# Patient Record
Sex: Female | Born: 1950 | Race: Black or African American | Hispanic: No | Smoking: Former smoker
Health system: Southern US, Community
[De-identification: ages and names within clinical notes are randomized; demographics above are authoritative.]

## PROBLEM LIST (undated history)

## (undated) DIAGNOSIS — R011 Cardiac murmur, unspecified: Secondary | ICD-10-CM

## (undated) DIAGNOSIS — M1990 Unspecified osteoarthritis, unspecified site: Secondary | ICD-10-CM

## (undated) DIAGNOSIS — N189 Chronic kidney disease, unspecified: Secondary | ICD-10-CM

## (undated) DIAGNOSIS — H269 Unspecified cataract: Secondary | ICD-10-CM

## (undated) HISTORY — PX: OTHER SURGICAL HISTORY: SHX169

## (undated) HISTORY — PX: CATARACT EXTRACTION, BILATERAL: SHX1313

## (undated) HISTORY — DX: Cardiac murmur, unspecified: R01.1

## (undated) HISTORY — DX: Chronic kidney disease, unspecified: N18.9

## (undated) HISTORY — PX: MYOMECTOMY: SHX85

## (undated) HISTORY — PX: ABDOMINAL HYSTERECTOMY: SHX81

## (undated) HISTORY — DX: Unspecified cataract: H26.9

## (undated) HISTORY — DX: Unspecified osteoarthritis, unspecified site: M19.90

---

## 2020-06-20 ENCOUNTER — Inpatient Hospital Stay (HOSPITAL_COMMUNITY): Payer: Medicare HMO | Attending: Hematology and Oncology | Admitting: Hematology and Oncology

## 2020-06-20 ENCOUNTER — Encounter (HOSPITAL_COMMUNITY): Payer: Self-pay | Admitting: Hematology and Oncology

## 2020-06-20 ENCOUNTER — Inpatient Hospital Stay (HOSPITAL_COMMUNITY): Payer: Medicare HMO

## 2020-06-20 ENCOUNTER — Other Ambulatory Visit: Payer: Self-pay

## 2020-06-20 VITALS — BP 134/69 | HR 54 | Resp 16 | Ht 63.25 in | Wt 121.9 lb

## 2020-06-20 DIAGNOSIS — I129 Hypertensive chronic kidney disease with stage 1 through stage 4 chronic kidney disease, or unspecified chronic kidney disease: Secondary | ICD-10-CM | POA: Diagnosis not present

## 2020-06-20 DIAGNOSIS — D72819 Decreased white blood cell count, unspecified: Secondary | ICD-10-CM | POA: Diagnosis not present

## 2020-06-20 DIAGNOSIS — N189 Chronic kidney disease, unspecified: Secondary | ICD-10-CM | POA: Insufficient documentation

## 2020-06-20 DIAGNOSIS — D649 Anemia, unspecified: Secondary | ICD-10-CM

## 2020-06-20 DIAGNOSIS — Z87891 Personal history of nicotine dependence: Secondary | ICD-10-CM | POA: Diagnosis not present

## 2020-06-20 DIAGNOSIS — D509 Iron deficiency anemia, unspecified: Secondary | ICD-10-CM | POA: Insufficient documentation

## 2020-06-20 DIAGNOSIS — N200 Calculus of kidney: Secondary | ICD-10-CM | POA: Diagnosis not present

## 2020-06-20 LAB — RETICULOCYTES
Immature Retic Fract: 12.3 % (ref 2.3–15.9)
RBC.: 3.4 MIL/uL — ABNORMAL LOW (ref 3.87–5.11)
Retic Count, Absolute: 38.8 10*3/uL (ref 19.0–186.0)
Retic Ct Pct: 1.1 % (ref 0.4–3.1)

## 2020-06-20 LAB — COMPREHENSIVE METABOLIC PANEL
ALT: 15 U/L (ref 0–44)
AST: 18 U/L (ref 15–41)
Albumin: 4 g/dL (ref 3.5–5.0)
Alkaline Phosphatase: 31 U/L — ABNORMAL LOW (ref 38–126)
Anion gap: 7 (ref 5–15)
BUN: 23 mg/dL (ref 8–23)
CO2: 27 mmol/L (ref 22–32)
Calcium: 9.6 mg/dL (ref 8.9–10.3)
Chloride: 102 mmol/L (ref 98–111)
Creatinine, Ser: 1.24 mg/dL — ABNORMAL HIGH (ref 0.44–1.00)
GFR, Estimated: 46 mL/min — ABNORMAL LOW (ref >60)
Glucose, Bld: 99 mg/dL (ref 70–99)
Potassium: 4 mmol/L (ref 3.5–5.1)
Sodium: 136 mmol/L (ref 135–145)
Total Bilirubin: 0.8 mg/dL (ref 0.3–1.2)
Total Protein: 7 g/dL (ref 6.5–8.1)

## 2020-06-20 LAB — CBC WITH DIFFERENTIAL/PLATELET
Abs Immature Granulocytes: 0 10*3/uL (ref 0.00–0.07)
Basophils Absolute: 0 10*3/uL (ref 0.0–0.1)
Basophils Relative: 0 %
Eosinophils Absolute: 0.1 10*3/uL (ref 0.0–0.5)
Eosinophils Relative: 2 %
HCT: 29.9 % — ABNORMAL LOW (ref 36.0–46.0)
Hemoglobin: 9.5 g/dL — ABNORMAL LOW (ref 12.0–15.0)
Immature Granulocytes: 0 %
Lymphocytes Relative: 43 %
Lymphs Abs: 1.5 10*3/uL (ref 0.7–4.0)
MCH: 28.3 pg (ref 26.0–34.0)
MCHC: 31.8 g/dL (ref 30.0–36.0)
MCV: 89 fL (ref 80.0–100.0)
Monocytes Absolute: 0.2 10*3/uL (ref 0.1–1.0)
Monocytes Relative: 6 %
Neutro Abs: 1.7 10*3/uL (ref 1.7–7.7)
Neutrophils Relative %: 49 %
Platelets: 152 10*3/uL (ref 150–400)
RBC: 3.36 MIL/uL — ABNORMAL LOW (ref 3.87–5.11)
RDW: 14.9 % (ref 11.5–15.5)
WBC: 3.4 10*3/uL — ABNORMAL LOW (ref 4.0–10.5)
nRBC: 0 % (ref 0.0–0.2)

## 2020-06-20 LAB — IRON AND TIBC
Iron: 31 ug/dL (ref 28–170)
Saturation Ratios: 12 % (ref 10.4–31.8)
TIBC: 269 ug/dL (ref 250–450)
UIBC: 238 ug/dL

## 2020-06-20 LAB — VITAMIN B12: Vitamin B-12: 794 pg/mL (ref 180–914)

## 2020-06-20 LAB — FERRITIN: Ferritin: 205 ng/mL (ref 11–307)

## 2020-06-20 LAB — LACTATE DEHYDROGENASE: LDH: 157 U/L (ref 98–192)

## 2020-06-20 NOTE — Progress Notes (Signed)
Unable to assess medication list patient states that she is having this faxed over.

## 2020-06-20 NOTE — Progress Notes (Signed)
[facility] CONSULT NOTE  No care team member to display  CHIEF COMPLAINTS/PURPOSE OF CONSULTATION:  IDA  ASSESSMENT & PLAN:  No problem-specific Assessment & Plan notes found for this encounter.  Orders Placed This Encounter  Procedures  . CBC with Differential/Platelet    Standing Status:   Standing    Number of Occurrences:   22    Standing Expiration Date:   06/20/2021  . Iron and TIBC    Standing Status:   Future    Standing Expiration Date:   06/20/2021  . Ferritin    Standing Status:   Future    Standing Expiration Date:   06/20/2021  . Vitamin B12    Standing Status:   Future    Standing Expiration Date:   06/20/2021  . Folate RBC    Standing Status:   Future    Standing Expiration Date:   06/20/2021  . Lactate dehydrogenase    Standing Status:   Future    Standing Expiration Date:   06/20/2021  . Reticulocytes    Standing Status:   Future    Standing Expiration Date:   06/20/2021  . Comprehensive metabolic panel    Standing Status:   Standing    Number of Occurrences:   33    Standing Expiration Date:   06/20/2021  . SPEP (Serum protein electrophoresis)    Standing Status:   Future    Standing Expiration Date:   06/20/2021  . Kappa/lambda light chains    Standing Status:   Future    Standing Expiration Date:   06/20/2021   1. 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 Hb ranging in 10-11, mild leukopenia which has been her baseline. No thrombocytopenia CBC, iron panel, b12, folate, LDH, retic count SPEP today. No concern for active blood loss from history If she is indeed iron deficient, will benefit from IV Iron. She is agreeable to IV iron, Discussed minimal side effects and very rare chances of anaphylaxis  2. Age appropriate cancer screening recommended  3. HTN, well controlled overall, she had a log  4. CKD and nephrolithiasis, FU with nephrology  HISTORY OF  PRESENTING ILLNESS:  Jennifer Li 70 y.o. female is here because of anemia.  70 yr old female patient who complains of extreme fatigue, sleepiness over the past few months. She had colonoscopy, capsule endoscopy which were normal according tot the patient. She has hemorrhoids so has noted small amount of blood when she is having a hemorrhoid flare. She otherwise has not noted any hematochezia or melena. No change in breathing, NO PICA or hair loss She has become a complete vegan in June 2021.  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  REVIEW OF SYSTEMS:   Constitutional: Denies fevers, chills or abnormal night sweats Eyes: Denies blurriness of vision, double vision or watery eyes Ears, nose, mouth, throat, and face: Denies mucositis or sore throat Respiratory: Denies cough, dyspnea or wheezes Cardiovascular: Denies palpitation, chest discomfort or lower extremity swelling Gastrointestinal:  Denies change in bowel habits Skin: Denies abnormal skin rashes Lymphatics: Denies new lymphadenopathy or easy bruising Neurological:Denies numbness, tingling or new weaknesses Behavioral/Psych: Mood is stable, no new changes  All other systems were reviewed with the patient and are negative.  MEDICAL HISTORY:  Past Medical History:  Diagnosis Date  .  Arthritis   . Cataract   . Chronic kidney disease    stage 3  . Heart murmur     SURGICAL HISTORY: Past Surgical History:  Procedure Laterality Date  . ABDOMINAL HYSTERECTOMY    . CATARACT EXTRACTION, BILATERAL    . gaglion cyst removal    . MYOMECTOMY      SOCIAL HISTORY: Social History   Socioeconomic History  . Marital status: Widowed    Spouse name: Not on file  . Number of children: Not on file  . Years of education: Not on file  . Highest education level: Not on file  Occupational History  . Not on file   Tobacco Use  . Smoking status: Former Games developer  . Smokeless tobacco: Never Used  Substance and Sexual Activity  . Alcohol use: Never  . Drug use: Never  . Sexual activity: Not Currently  Other Topics Concern  . Not on file  Social History Narrative  . Not on file   Social Determinants of Health   Financial Resource Strain: Low Risk   . Difficulty of Paying Living Expenses: Not hard at all  Food Insecurity: Food Insecurity Present  . Worried About Programme researcher, broadcasting/film/video in the Last Year: Sometimes true  . Ran Out of Food in the Last Year: Sometimes true  Transportation Needs: Unmet Transportation Needs  . Lack of Transportation (Medical): Yes  . Lack of Transportation (Non-Medical): No  Physical Activity: Not on file  Stress: No Stress Concern Present  . Feeling of Stress : Not at all  Social Connections: Moderately Integrated  . Frequency of Communication with Friends and Family: More than three times a week  . Frequency of Social Gatherings with Friends and Family: Once a week  . Attends Religious Services: More than 4 times per year  . Active Member of Clubs or Organizations: Yes  . Attends Banker Meetings: Never  . Marital Status: Widowed  Intimate Partner Violence: Not At Risk  . Fear of Current or Ex-Partner: No  . Emotionally Abused: No  . Physically Abused: No  . Sexually Abused: No    FAMILY HISTORY: Family History  Problem Relation Age of Onset  . Diabetes Sister   . Diabetes Son     ALLERGIES:  is allergic to escitalopram, gemfibrozil, sulfa antibiotics, and trazodone.  MEDICATIONS:  No current outpatient medications on file.   No current facility-administered medications for this visit.     PHYSICAL EXAMINATION: ECOG PERFORMANCE STATUS: 1 - Symptomatic but completely ambulatory  Vitals:   06/20/20 1135  BP: 134/69  Pulse: (!) 54  Resp: 16  SpO2: 100%   Filed Weights   06/20/20 1134  Weight: 121 lb 14.6 oz (55.3 kg)     GENERAL:alert, no distress and comfortable SKIN: skin color, texture, turgor are normal, no rashes or significant lesions EYES: normal, conjunctiva are pink and non-injected, sclera clear OROPHARYNX:no exudate, no erythema and lips, buccal mucosa, and tongue normal  NECK: supple, thyroid normal size, non-tender, without nodularity LYMPH:  no palpable lymphadenopathy in the cervical, axillary or inguinal LUNGS: clear to auscultation and percussion with normal breathing effort HEART: regular rate & rhythm and no murmurs and no lower extremity edema ABDOMEN:abdomen soft, non-tender and normal bowel sounds Musculoskeletal:no cyanosis of digits and no clubbing  PSYCH: alert & oriented x 3 with fluent speech NEURO: no focal motor/sensory deficits  LABORATORY DATA:  I have reviewed the data as listed No results found for: WBC, HGB, HCT, MCV,  PLT   Chemistry   No results found for: NA, K, CL, CO2, BUN, CREATININE, GLU No results found for: CALCIUM, ALKPHOS, AST, ALT, BILITOT     RADIOGRAPHIC STUDIES: I have personally reviewed the radiological images as listed and agreed with the findings in the report. No results found.  All questions were answered. The patient knows to call the clinic with any problems, questions or concerns. I spent 35 minutes in the care of this patient including H and P, review of records, counseling and coordination of care.     Rachel Moulds, MD 06/20/2020 12:36 PM

## 2020-06-23 LAB — PROTEIN ELECTROPHORESIS, SERUM
A/G Ratio: 1.5 (ref 0.7–1.7)
Albumin ELP: 4 g/dL (ref 2.9–4.4)
Alpha-1-Globulin: 0.2 g/dL (ref 0.0–0.4)
Alpha-2-Globulin: 0.7 g/dL (ref 0.4–1.0)
Beta Globulin: 0.8 g/dL (ref 0.7–1.3)
Gamma Globulin: 1 g/dL (ref 0.4–1.8)
Globulin, Total: 2.7 g/dL (ref 2.2–3.9)
Total Protein ELP: 6.7 g/dL (ref 6.0–8.5)

## 2020-06-23 LAB — FOLATE RBC
Folate, Hemolysate: 438 ng/mL
Folate, RBC: 1470 ng/mL (ref >498)
Hematocrit: 29.8 % — ABNORMAL LOW (ref 34.0–46.6)

## 2020-06-23 LAB — KAPPA/LAMBDA LIGHT CHAINS
Kappa free light chain: 29.5 mg/L — ABNORMAL HIGH (ref 3.3–19.4)
Kappa, lambda light chain ratio: 1.52 (ref 0.26–1.65)
Lambda free light chains: 19.4 mg/L (ref 5.7–26.3)

## 2020-06-24 ENCOUNTER — Telehealth (HOSPITAL_COMMUNITY): Payer: Self-pay

## 2020-06-24 NOTE — Telephone Encounter (Signed)
Patient called and requested notes from last office visit be faxed to her primary care physician office.  Stated that she has an appointment with him tomorrow and would like him to be aware.  No further questions or concerns at this time.

## 2020-07-18 ENCOUNTER — Encounter (HOSPITAL_COMMUNITY): Payer: Self-pay | Admitting: Hematology and Oncology

## 2020-07-18 ENCOUNTER — Inpatient Hospital Stay (HOSPITAL_COMMUNITY): Payer: Medicare HMO | Attending: Hematology and Oncology | Admitting: Hematology and Oncology

## 2020-07-18 ENCOUNTER — Other Ambulatory Visit: Payer: Self-pay

## 2020-07-18 VITALS — BP 133/70 | HR 61 | Temp 98.4°F | Resp 17 | Wt 119.2 lb

## 2020-07-18 DIAGNOSIS — N1831 Chronic kidney disease, stage 3a: Secondary | ICD-10-CM

## 2020-07-18 DIAGNOSIS — I129 Hypertensive chronic kidney disease with stage 1 through stage 4 chronic kidney disease, or unspecified chronic kidney disease: Secondary | ICD-10-CM | POA: Diagnosis not present

## 2020-07-18 DIAGNOSIS — Z87891 Personal history of nicotine dependence: Secondary | ICD-10-CM | POA: Insufficient documentation

## 2020-07-18 DIAGNOSIS — D649 Anemia, unspecified: Secondary | ICD-10-CM

## 2020-07-18 DIAGNOSIS — N189 Chronic kidney disease, unspecified: Secondary | ICD-10-CM | POA: Diagnosis not present

## 2020-07-18 DIAGNOSIS — I10 Essential (primary) hypertension: Secondary | ICD-10-CM | POA: Diagnosis not present

## 2020-07-18 NOTE — Progress Notes (Signed)
[facility] CONSULT NOTE  Patient Care Team: Pcp, No as PCP - General  CHIEF COMPLAINTS/PURPOSE OF CONSULTATION:  IDA  ASSESSMENT & PLAN:  No problem-specific Assessment & Plan notes found for this encounter.  No orders of the defined types were placed in this encounter.  1 Normocytic normochromic anemia Repeat labs reviewed, no nutritional deficiency, No hemolysis No Monoclonal gammopathy She reports some intermittent bright red blood from her bottom. We called her primary doc's office and they read out the colonoscopy and capsule endoscopy reports to [place], these do not have evidence of malignancy. At this time, I discussed considering BMB since she has persistent anemia, she is agreeable. I ordered CT guided BMB. She will RTC after BMB to review recommendations.  2. Age appropriate cancer screening recommended  3. HTN, well controlled overall, she had a log  4. CKD and nephrolithiasis, FU with nephrology  HISTORY OF PRESENTING ILLNESS:  Jennifer Li 70 y.o. female is here because of anemia.  70 yr old female patient who complains of extreme fatigue, sleepiness over the past few months.   She had colonoscopy, capsule endoscopy which were normal according tot the patient. She has hemorrhoids so has noted some bleeding when she is having a hemorrhoid flare. She otherwise has not noted any hematochezia or melena. No change in breathing, NO PICA or hair loss She has become a complete vegan in June 2021.  Since last visit, she continues to complain of fatigue. 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  REVIEW OF SYSTEMS:    Constitutional: Denies fevers, chills or abnormal night sweats Eyes: Denies blurriness of vision, double vision or watery eyes Ears, nose, mouth, throat, and face: Denies mucositis or sore throat Respiratory: Denies cough, dyspnea or wheezes Cardiovascular:  Denies palpitation, chest discomfort or lower extremity swelling Gastrointestinal:  Denies change in bowel habits Skin: Denies abnormal skin rashes Lymphatics: Denies new lymphadenopathy or easy bruising Neurological:Denies numbness, tingling or new weaknesses Behavioral/Psych: Mood is stable, no new changes  All other systems were reviewed with the patient and are negative.  MEDICAL HISTORY:  Past Medical History:  Diagnosis Date  . Arthritis   . Cataract   . Chronic kidney disease    stage 3  . Heart murmur     SURGICAL HISTORY: Past Surgical History:  Procedure Laterality Date  . ABDOMINAL HYSTERECTOMY    . CATARACT EXTRACTION, BILATERAL    . gaglion cyst removal    . MYOMECTOMY      SOCIAL HISTORY: Social History   Socioeconomic History  . Marital status: Widowed    Spouse name: Not on file  . Number of children: Not on file  . Years of education: Not on file  . Highest education level: Not on file  Occupational History  . Not on file  Tobacco Use  . Smoking status: Former Games developer  . Smokeless tobacco: Never Used  Substance and Sexual Activity  . Alcohol use: Never  . Drug use: Never  . Sexual activity: Not Currently  Other Topics Concern  . Not on file  Social History Narrative  . Not on file   Social Determinants of Health   Financial Resource Strain: Low Risk   . Difficulty of Paying Living Expenses: Not hard at all  Food Insecurity: Food Insecurity Present  . Worried About Programme researcher, broadcasting/film/video in the Last Year: Sometimes true  . Ran Out of Food  in the Last Year: Sometimes true  Transportation Needs: Unmet Transportation Needs  . Lack of Transportation (Medical): Yes  . Lack of Transportation (Non-Medical): No  Physical Activity: Not on file  Stress: No Stress Concern Present  . Feeling of Stress : Not at all  Social Connections: Moderately Integrated  . Frequency of Communication with Friends and Family: More than three times a week  . Frequency  of Social Gatherings with Friends and Family: Once a week  . Attends Religious Services: More than 4 times per year  . Active Member of Clubs or Organizations: Yes  . Attends Banker Meetings: Never  . Marital Status: Widowed  Intimate Partner Violence: Not At Risk  . Fear of Current or Ex-Partner: No  . Emotionally Abused: No  . Physically Abused: No  . Sexually Abused: No    FAMILY HISTORY: Family History  Problem Relation Age of Onset  . Diabetes Sister   . Diabetes Son     ALLERGIES:  is allergic to escitalopram, gemfibrozil, sulfa antibiotics, and trazodone.  MEDICATIONS:  Current Outpatient Medications  Medication Sig Dispense Refill  . losartan (COZAAR) 100 MG tablet losartan 100 mg tablet    . metoprolol tartrate (LOPRESSOR) 100 MG tablet metoprolol tartrate 100 mg tablet    . nitroGLYCERIN (NITROSTAT) 0.4 MG SL tablet nitroglycerin 0.4 mg sublingual tablet    . spironolactone (ALDACTONE) 25 MG tablet spironolactone 25 mg tablet    . amitriptyline (ELAVIL) 25 MG tablet Take 25 mg by mouth at bedtime.    Marland Kitchen ascorbic acid (VITAMIN C) 1000 MG tablet Take by mouth.    . Cholecalciferol 25 MCG (1000 UT) capsule 1 capsule 1 (one) time each day    . clonazePAM (KLONOPIN) 0.5 MG tablet clonazepam 0.5 mg tablet    . cloNIDine (CATAPRES) 0.1 MG tablet clonidine HCl 0.1 mg tablet    . cycloSPORINE (RESTASIS) 0.05 % ophthalmic emulsion Restasis 0.05 % eye drops in a dropperette    . Ferrous Gluconate 239 (27 Fe) MG TABS Take by mouth.    Marland Kitchen NIFEdipine (PROCARDIA XL/NIFEDICAL XL) 60 MG 24 hr tablet Take 60 mg by mouth daily.    . pantoprazole (PROTONIX) 40 MG tablet TAKE 1 TABLET EVERY DAY BY ORAL ROUTE AS NEEDED.    Marland Kitchen potassium chloride SA (KLOR-CON) 20 MEQ tablet Klor-Con M20 mEq tablet,extended release    . RESTASIS 0.05 % ophthalmic emulsion     . temazepam (RESTORIL) 15 MG capsule temazepam 15 mg capsule  PRN     No current facility-administered medications for  this visit.     PHYSICAL EXAMINATION: ECOG PERFORMANCE STATUS: 1 - Symptomatic but completely ambulatory  Vitals:   07/18/20 1212  BP: 133/70  Pulse: 61  Resp: 17  Temp: 98.4 F (36.9 C)  SpO2: 100%   Filed Weights   07/18/20 1212  Weight: 119 lb 4 oz (54.1 kg)    GENERAL:alert, no distress and comfortable SKIN: skin color, texture, turgor are normal, no rashes or significant lesions EYES: normal, conjunctiva are pink and non-injected, sclera clear OROPHARYNX:no exudate, no erythema and lips, buccal mucosa, and tongue normal  NECK: supple, thyroid normal size, non-tender, without nodularity LYMPH:  no palpable lymphadenopathy in the cervical, axillary or inguinal LUNGS: clear to auscultation and percussion with normal breathing effort HEART: regular rate & rhythm and no murmurs and no lower extremity edema ABDOMEN:abdomen soft, non-tender and normal bowel sounds Musculoskeletal:no cyanosis of digits and no clubbing  PSYCH: alert & oriented  x 3 with fluent speech NEURO: no focal motor/sensory deficits  LABORATORY DATA:  I have reviewed the data as listed Lab Results  Component Value Date   WBC 3.4 (L) 06/20/2020   HGB 9.5 (L) 06/20/2020   HCT 29.9 (L) 06/20/2020   HCT 29.8 (L) 06/20/2020   MCV 89.0 06/20/2020   PLT 152 06/20/2020     Chemistry      Component Value Date/Time   NA 136 06/20/2020 1315   K 4.0 06/20/2020 1315   CL 102 06/20/2020 1315   CO2 27 06/20/2020 1315   BUN 23 06/20/2020 1315   CREATININE 1.24 (H) 06/20/2020 1315      Component Value Date/Time   CALCIUM 9.6 06/20/2020 1315   ALKPHOS 31 (L) 06/20/2020 1315   AST 18 06/20/2020 1315   ALT 15 06/20/2020 1315   BILITOT 0.8 06/20/2020 1315      RADIOGRAPHIC STUDIES: I have personally reviewed the radiological images as listed and agreed with the findings in the report.  No results found.  All questions were answered. The patient knows to call the clinic with any problems, questions or  concerns.  I spent 30 minutes in the care of this patient including H and P, review of records, counseling and coordination of care.     Rachel Moulds, MD 07/18/2020 12:50 PM

## 2020-08-06 ENCOUNTER — Other Ambulatory Visit: Payer: Self-pay | Admitting: Radiology

## 2020-08-07 ENCOUNTER — Ambulatory Visit (HOSPITAL_COMMUNITY)
Admission: RE | Admit: 2020-08-07 | Discharge: 2020-08-07 | Disposition: A | Discharge status: Home / Self Care | Payer: Medicare HMO | Source: Ambulatory Visit | Attending: Hematology and Oncology | Admitting: Hematology and Oncology

## 2020-08-07 ENCOUNTER — Encounter (HOSPITAL_COMMUNITY): Payer: Self-pay

## 2020-08-07 ENCOUNTER — Other Ambulatory Visit: Payer: Self-pay

## 2020-08-07 DIAGNOSIS — D72822 Plasmacytosis: Secondary | ICD-10-CM | POA: Insufficient documentation

## 2020-08-07 DIAGNOSIS — Z87891 Personal history of nicotine dependence: Secondary | ICD-10-CM | POA: Diagnosis not present

## 2020-08-07 DIAGNOSIS — Z01812 Encounter for preprocedural laboratory examination: Secondary | ICD-10-CM | POA: Diagnosis not present

## 2020-08-07 DIAGNOSIS — M1990 Unspecified osteoarthritis, unspecified site: Secondary | ICD-10-CM | POA: Diagnosis not present

## 2020-08-07 DIAGNOSIS — D649 Anemia, unspecified: Secondary | ICD-10-CM | POA: Diagnosis not present

## 2020-08-07 DIAGNOSIS — Z79899 Other long term (current) drug therapy: Secondary | ICD-10-CM | POA: Insufficient documentation

## 2020-08-07 DIAGNOSIS — N183 Chronic kidney disease, stage 3 unspecified: Secondary | ICD-10-CM | POA: Diagnosis not present

## 2020-08-07 LAB — CBC WITH DIFFERENTIAL/PLATELET
Abs Immature Granulocytes: 0.02 10*3/uL (ref 0.00–0.07)
Basophils Absolute: 0 10*3/uL (ref 0.0–0.1)
Basophils Relative: 0 %
Eosinophils Absolute: 0.1 10*3/uL (ref 0.0–0.5)
Eosinophils Relative: 2 %
HCT: 30.2 % — ABNORMAL LOW (ref 36.0–46.0)
Hemoglobin: 9.7 g/dL — ABNORMAL LOW (ref 12.0–15.0)
Immature Granulocytes: 1 %
Lymphocytes Relative: 40 %
Lymphs Abs: 1.6 10*3/uL (ref 0.7–4.0)
MCH: 27.9 pg (ref 26.0–34.0)
MCHC: 32.1 g/dL (ref 30.0–36.0)
MCV: 86.8 fL (ref 80.0–100.0)
Monocytes Absolute: 0.3 10*3/uL (ref 0.1–1.0)
Monocytes Relative: 8 %
Neutro Abs: 2 10*3/uL (ref 1.7–7.7)
Neutrophils Relative %: 49 %
Platelets: 122 10*3/uL — ABNORMAL LOW (ref 150–400)
RBC: 3.48 MIL/uL — ABNORMAL LOW (ref 3.87–5.11)
RDW: 15 % (ref 11.5–15.5)
WBC: 4 10*3/uL (ref 4.0–10.5)
nRBC: 0 % (ref 0.0–0.2)

## 2020-08-07 LAB — PROTIME-INR
INR: 1.1 (ref 0.8–1.2)
Prothrombin Time: 13.3 seconds (ref 11.4–15.2)

## 2020-08-07 MED ORDER — SODIUM CHLORIDE 0.9 % IV SOLN: INTRAVENOUS | Status: DC

## 2020-08-07 MED ORDER — MIDAZOLAM HCL 2 MG/2ML IJ SOLN
INTRAMUSCULAR | Status: AC
  Filled 2020-08-07: qty 2

## 2020-08-07 MED ORDER — LIDOCAINE HCL (PF) 1 % IJ SOLN
INTRAMUSCULAR | Status: AC | PRN
  Administered 2020-08-07: 10 mL via INTRADERMAL

## 2020-08-07 MED ORDER — MIDAZOLAM HCL 2 MG/2ML IJ SOLN
INTRAMUSCULAR | Status: AC | PRN
  Administered 2020-08-07: 1 mg via INTRAVENOUS

## 2020-08-07 MED ORDER — FENTANYL CITRATE (PF) 100 MCG/2ML IJ SOLN
INTRAMUSCULAR | Status: AC | PRN
  Administered 2020-08-07: 50 ug via INTRAVENOUS

## 2020-08-07 MED ORDER — FLUMAZENIL 0.5 MG/5ML IV SOLN
INTRAVENOUS | Status: AC
  Filled 2020-08-07: qty 5

## 2020-08-07 MED ORDER — FENTANYL CITRATE (PF) 100 MCG/2ML IJ SOLN
INTRAMUSCULAR | Status: AC
  Filled 2020-08-07: qty 2

## 2020-08-07 MED ORDER — NALOXONE HCL 0.4 MG/ML IJ SOLN
INTRAMUSCULAR | Status: AC
  Filled 2020-08-07: qty 1

## 2020-08-07 NOTE — Discharge Instructions (Addendum)
Bone Marrow Aspiration and Bone Marrow Biopsy, Adult, Care After  If you have problems or questions, contact your health care provider. What can I expect after the procedure? After the procedure, it is common to have:  Mild pain and tenderness.  Swelling.  Bruising. Follow these instructions at home: Puncture site care You may remove the dressing in 24 hours (11:30am Friday 08/08/20) You do not have to place another dressing over the site.  Check your puncture site every day for signs of infection. Check for: ? More redness, swelling, or pain. ? Fluid or blood. ? Warmth. ? Pus or a bad smell.   Activity  Return to your normal daily activities in 24 hours.  Do not lift anything that is heavier than 10 lb (4.5 kg) for about 1 week.  Do not drive for 24 hours if you were given a sedative during your procedure. General instructions  Take over-the-counter and prescription medicines only as told by your health care provider.  Do not take baths, swim, or use a hot tub until your health care provider approves. You may shower after 24 hours and the dressing has been removed.  For comfort: put ice on the affected area. To do this: ? Put ice in a plastic bag. ? Place a towel between your skin and the bag. ? Leave the ice on for 20 minutes, 2-3 times a day.  Keep all follow-up visits as told by your health care provider. This is important.   Contact a health care provider if:  Your pain is not controlled with medicine.  You have a fever.  You have more redness, swelling, or pain around the puncture site.  You have fluid or blood coming from the puncture site.  Your puncture site feels warm to the touch.  You have pus or a bad smell coming from the puncture site. Summary  After the procedure, it is common to have mild pain, tenderness, swelling, and bruising.  Follow instructions from your health care provider about how to take care of the puncture site and what activities  are safe for you.  Take over-the-counter and prescription medicines only as told by your health care provider.  Contact a health care provider if you have any signs of infection, such as fluid or blood coming from the puncture site.  Moderate Conscious Sedation, Adult, Care After This sheet gives you information about how to care for yourself after your procedure. Your health care provider may also give you more specific instructions. If you have problems or questions, contact your health care provider. What can I expect after the procedure? After the procedure, it is common to have:  Sleepiness for several hours.  Impaired judgment for several hours.  Difficulty with balance.  Vomiting if you eat too soon. Follow these instructions at home: Rest for the next 24 hours. For the next 24 hours:  Do not participate in activities where you could fall or become injured.  Do not drive or use machinery.  Do not drink alcohol.  Do not take sleeping pills or medicines that cause drowsiness.  Do not make important decisions or sign legal documents.  Do not take care of children on your own.      Eating and drinking  Diet as tolerated  Drink enough fluid to keep your urine pale yellow.  If you vomit: ? Drink water, juice, or soup when you can drink without vomiting. ? Make sure you have little or no nausea before eating solid  foods.   General instructions  Take over-the-counter and prescription medicines as directed.  Have a responsible adult stay with you for 24 hours. It is important to have someone help care for you until you are awake and alert.  Do not smoke.  Keep all follow-up visits as told by your health care provider. This is important. Contact a health care provider if:  You are still sleepy or having trouble with balance after 24 hours.  You feel light-headed.  You keep feeling nauseous or you keep vomiting.  You develop a rash.  You have a fever.  You  have redness or swelling around the IV site. Get help right away if:  You have trouble breathing.  You have new-onset confusion at home. Summary  After the procedure, it is common to feel sleepy, have impaired judgment, or feel nauseous if you eat too soon.  Rest after you get home. Know the things you should not do after the procedure.  Follow the diet recommended by your health care provider and drink enough fluid to keep your urine pale yellow.  Get help right away if you have trouble breathing or new-onset confusion at home.

## 2020-08-07 NOTE — Procedures (Signed)
Interventional Radiology Procedure Note  Procedure: CT guided aspirate and core biopsy of right iliac bone Complications: None Recommendations: - Bedrest supine x 1 hrs - Hydrocodone PRN  Pain - Follow biopsy results  Signed,  Harlan Vinal K. Gaylon Bentz, MD   

## 2020-08-07 NOTE — Consult Note (Signed)
Chief Complaint: Patient was seen in consultation today for CT guided bone marrow biopsy  Referring Physician(s): Iruku,Praveena  Supervising Physician: Jacqulynn Cadet  Patient Status: [facility] - Out-pt  History of Present Illness: Jennifer Li is a 70 y.o. female with past medical history of arthritis, chronic kidney disease and now with anemia of uncertain etiology.  She presents today for CT-guided bone marrow biopsy for further evaluation.   Past Medical History:  Diagnosis Date  . Arthritis   . Cataract   . Chronic kidney disease    stage 3  . Heart murmur     Past Surgical History:  Procedure Laterality Date  . ABDOMINAL HYSTERECTOMY    . CATARACT EXTRACTION, BILATERAL    . gaglion cyst removal    . MYOMECTOMY      Allergies: Escitalopram, Gemfibrozil, Sulfa antibiotics, and Trazodone  Medications: Prior to Admission medications   Medication Sig Start Date End Date Taking? Authorizing Provider  amitriptyline (ELAVIL) 25 MG tablet Take 25 mg by mouth at bedtime. 02/19/20   [provider]  ascorbic acid (VITAMIN C) 1000 MG tablet Take by mouth.    [provider]  Cholecalciferol 25 MCG (1000 UT) capsule 1 capsule 1 (one) time each day    [provider]  clonazePAM (KLONOPIN) 0.5 MG tablet clonazepam 0.5 mg tablet    [provider]  cloNIDine (CATAPRES) 0.1 MG tablet clonidine HCl 0.1 mg tablet    [provider]  cycloSPORINE (RESTASIS) 0.05 % ophthalmic emulsion Restasis 0.05 % eye drops in a dropperette    [provider]  Ferrous Gluconate 239 (27 Fe) MG TABS Take by mouth.    [provider]  losartan (COZAAR) 100 MG tablet losartan 100 mg tablet 08/13/19   [provider]  metoprolol tartrate (LOPRESSOR) 100 MG tablet metoprolol tartrate 100 mg tablet 01/23/20   [provider]  NIFEdipine (PROCARDIA XL/NIFEDICAL XL) 60 MG 24 hr tablet Take 60 mg by mouth daily. 03/12/20    [provider]  nitroGLYCERIN (NITROSTAT) 0.4 MG SL tablet nitroglycerin 0.4 mg sublingual tablet 12/15/16   [provider]  pantoprazole (PROTONIX) 40 MG tablet TAKE 1 TABLET EVERY DAY BY ORAL ROUTE AS NEEDED. 07/13/20   [provider]  potassium chloride SA (KLOR-CON) 20 MEQ tablet Klor-Con M20 mEq tablet,extended release    [provider]  RESTASIS 0.05 % ophthalmic emulsion  04/25/20   [provider]  spironolactone (ALDACTONE) 25 MG tablet spironolactone 25 mg tablet 04/01/20   [provider]  temazepam (RESTORIL) 15 MG capsule temazepam 15 mg capsule  PRN    [provider]     Family History  Problem Relation Age of Onset  . Diabetes Sister   . Diabetes Son     Social History   Socioeconomic History  . Marital status: Widowed    Spouse name: Not on file  . Number of children: Not on file  . Years of education: Not on file  . Highest education level: Not on file  Occupational History  . Not on file  Tobacco Use  . Smoking status: Former Research scientist (life sciences)  . Smokeless tobacco: Never Used  Substance and Sexual Activity  . Alcohol use: Never  . Drug use: Never  . Sexual activity: Not Currently  Other Topics Concern  . Not on file  Social History Narrative  . Not on file   Social Determinants of Health   Financial Resource Strain: Low Risk   . Difficulty  of Paying Living Expenses: Not hard at all  Food Insecurity: Food Insecurity Present  . Worried About Charity fundraiser in the Last Year: Sometimes true  . Ran Out of Food in the Last Year: Sometimes true  Transportation Needs: Unmet Transportation Needs  . Lack of Transportation (Medical): Yes  . Lack of Transportation (Non-Medical): No  Physical Activity: Not on file  Stress: No Stress Concern Present  . Feeling of Stress : Not at all  Social Connections: Moderately Integrated  . Frequency of Communication with Friends and Family: More than three times a  week  . Frequency of Social Gatherings with Friends and Family: Once a week  . Attends Religious Services: More than 4 times per year  . Active Member of Clubs or Organizations: Yes  . Attends Archivist Meetings: Never  . Marital Status: Widowed      Review of Systems denies fever, headache, chest pain, dyspnea, cough, abdominal/back pain, nausea, vomiting or bleeding.  She has had some weight loss.  Vital Signs: BP 133/68   Pulse (!) 57   Temp 97.8 F (36.6 C) (Oral)   Resp 18   SpO2 100%   Physical Exam awake, alert.  Chest clear to auscultation bilaterally.  Heart with slightly bradycardic but regular rhythm.  Abdomen soft, positive bowel sounds, nontender.  No lower extremity edema.  Imaging: No results found.  Labs:  CBC: Recent Labs    06/20/20 1315  WBC 3.4*  HGB 9.5*  HCT 29.9*  29.8*  PLT 152    COAGS: No results for input(s): INR, APTT in the last 8760 hours.  BMP: Recent Labs    06/20/20 1315  NA 136  K 4.0  CL 102  CO2 27  GLUCOSE 99  BUN 23  CALCIUM 9.6  CREATININE 1.24*  GFRNONAA 46*    LIVER FUNCTION TESTS: Recent Labs    06/20/20 1315  BILITOT 0.8  AST 18  ALT 15  ALKPHOS 31*  PROT 7.0  ALBUMIN 4.0    TUMOR MARKERS: No results for input(s): AFPTM, CEA, CA199, CHROMGRNA in the last 8760 hours.  Assessment and Plan: 70 y.o. female with past medical history of arthritis, chronic kidney disease and now with anemia of uncertain etiology.  She presents today for CT-guided bone marrow biopsy for further evaluation.Risks and benefits of procedure was discussed with the patient  including, but not limited to bleeding, infection, damage to adjacent structures or low yield requiring additional tests.  All of the questions were answered and there is agreement to proceed.  Consent signed and in chart.     Thank you for this interesting consult.  I greatly enjoyed meeting Jennifer Li and look forward to participating in  their care.  A copy of this report was sent to the requesting provider on this date.  Electronically Signed: D. Rowe Robert, PA-C 08/07/2020, 8:56 AM   I spent a total of  20 minutes   in face to face in clinical consultation, greater than 50% of which was counseling/coordinating care for CT-guided bone marrow biopsy

## 2020-08-08 ENCOUNTER — Ambulatory Visit (HOSPITAL_COMMUNITY): Payer: Medicare HMO | Admitting: Hematology and Oncology

## 2020-08-15 ENCOUNTER — Encounter (HOSPITAL_COMMUNITY): Payer: Self-pay | Admitting: Hematology and Oncology

## 2020-08-18 LAB — SURGICAL PATHOLOGY

## 2020-08-21 ENCOUNTER — Ambulatory Visit (HOSPITAL_COMMUNITY): Payer: Medicare HMO | Admitting: Hematology

## 2020-08-22 ENCOUNTER — Other Ambulatory Visit: Payer: Self-pay

## 2020-08-22 ENCOUNTER — Inpatient Hospital Stay (HOSPITAL_COMMUNITY): Payer: Medicare HMO | Attending: Hematology and Oncology | Admitting: Hematology and Oncology

## 2020-08-22 ENCOUNTER — Encounter (HOSPITAL_COMMUNITY): Payer: Self-pay | Admitting: Hematology and Oncology

## 2020-08-22 VITALS — BP 116/68 | HR 59 | Temp 98.7°F | Resp 17 | Wt 121.4 lb

## 2020-08-22 DIAGNOSIS — D649 Anemia, unspecified: Secondary | ICD-10-CM | POA: Insufficient documentation

## 2020-08-22 DIAGNOSIS — N189 Chronic kidney disease, unspecified: Secondary | ICD-10-CM | POA: Insufficient documentation

## 2020-08-22 DIAGNOSIS — Z87891 Personal history of nicotine dependence: Secondary | ICD-10-CM | POA: Insufficient documentation

## 2020-08-22 DIAGNOSIS — N200 Calculus of kidney: Secondary | ICD-10-CM | POA: Diagnosis not present

## 2020-08-22 NOTE — Progress Notes (Signed)
[facility] CONSULT NOTE  Patient Care Team: System, Provider Not In as PCP - General  CHIEF COMPLAINTS/PURPOSE OF CONSULTATION:  IDA  ASSESSMENT & PLAN:  No problem-specific Assessment & Plan notes found for this encounter.  No orders of the defined types were placed in this encounter.  1 Normocytic normochromic anemia Repeat labs reviewed, no nutritional deficiency, No hemolysis No Monoclonal gammopathy She reports some intermittent bright red blood from her bottom. We called her primary doc's office and they read out the colonoscopy and capsule endoscopy reports to [place], these do not have evidence of malignancy. BMB without any evidence of dysplasia or increase in blasts. Bone marrow is normocellular with trilineage hematopoiesis. Mild polytypic plasmacytosis which is not of concern. At this time, there is no evidence of hematological cause for anemia, this is likely anemia of chronic disease. I have discussed about considering systemic scans to evaluate the weight loss. We discussed about CT or PET scan. Patient would like to return to clinic in 8 weeks and FU on labs and weight and then decide. She was encouraged to call us if she changes her mind.  2. Age appropriate cancer screening recommended  3. HTN, well controlled overall, she had a log  4. CKD and nephrolithiasis, FU with nephrology   HISTORY OF PRESENTING ILLNESS:   Jennifer Li 70 y.o. female is here because of anemia.  70 yr old female patient who complains of extreme fatigue and sleepiness over the past few months.   She had colonoscopy, capsule endoscopy which were normal according tot the patient.  She has hemorrhoids so has noted some bleeding when she is having a hemorrhoid flare. She otherwise has not noted any hematochezia or melena. No change in breathing, NO PICA or hair loss She has become a complete vegan in June 2021.   Interval history She complains of some ongoing fatigue,  weight appears to have stabilized. No change in bowel habits. Mild right sided abdominal pain, has history of nephrolithiasis, had a KUB done yesterday. She does bring up known diagnosis of malabsorption and gastritis No other concerns for me. Rest of the pertinent 10 point ROS reviewed and negative.  REVIEW OF SYSTEMS:    Constitutional: Denies fevers, chills or abnormal night sweats Eyes: Denies blurriness of vision, double vision or watery eyes Ears, nose, mouth, throat, and face: Denies mucositis or sore throat Respiratory: Denies cough, dyspnea or wheezes Cardiovascular: Denies palpitation, chest discomfort or lower extremity swelling Gastrointestinal:  Denies change in bowel habits Skin: Denies abnormal skin rashes Lymphatics: Denies new lymphadenopathy or easy bruising Neurological:Denies numbness, tingling or new weaknesses Behavioral/Psych: Mood is stable, no new changes  All other systems were reviewed with the patient and are negative.  MEDICAL HISTORY:  Past Medical History:  Diagnosis Date  . Arthritis   . Cataract   . Chronic kidney disease    stage 3  . Heart murmur     SURGICAL HISTORY: Past Surgical History:  Procedure Laterality Date  . ABDOMINAL HYSTERECTOMY    . CATARACT EXTRACTION, BILATERAL    . gaglion cyst removal    . MYOMECTOMY      SOCIAL HISTORY: Social History   Socioeconomic History  . Marital status: Widowed    Spouse name: Not on file  . Number of children: Not on file  . Years of education: Not on file  . Highest education level: Not on file  Occupational History  . Not on file  Tobacco Use  .  Smoking status: Former Research scientist (life sciences)  . Smokeless tobacco: Never Used  Substance and Sexual Activity  . Alcohol use: Never  . Drug use: Never  . Sexual activity: Not Currently  Other Topics Concern  . Not on file  Social History Narrative  . Not on file   Social Determinants of Health   Financial Resource Strain: Low Risk   . Difficulty  of Paying Living Expenses: Not hard at all  Food Insecurity: Food Insecurity Present  . Worried About Charity fundraiser in the Last Year: Sometimes true  . Ran Out of Food in the Last Year: Sometimes true  Transportation Needs: Unmet Transportation Needs  . Lack of Transportation (Medical): Yes  . Lack of Transportation (Non-Medical): No  Physical Activity: Not on file  Stress: No Stress Concern Present  . Feeling of Stress : Not at all  Social Connections: Moderately Integrated  . Frequency of Communication with Friends and Family: More than three times a week  . Frequency of Social Gatherings with Friends and Family: Once a week  . Attends Religious Services: More than 4 times per year  . Active Member of Clubs or Organizations: Yes  . Attends Archivist Meetings: Never  . Marital Status: Widowed  Intimate Partner Violence: Not At Risk  . Fear of Current or Ex-Partner: No  . Emotionally Abused: No  . Physically Abused: No  . Sexually Abused: No    FAMILY HISTORY: Family History  Problem Relation Age of Onset  . Diabetes Sister   . Diabetes Son     ALLERGIES:  is allergic to escitalopram, gemfibrozil, sulfa antibiotics, and trazodone.  MEDICATIONS:  Current Outpatient Medications  Medication Sig Dispense Refill  . amitriptyline (ELAVIL) 25 MG tablet Take 25 mg by mouth at bedtime.    Marland Kitchen ascorbic acid (VITAMIN C) 1000 MG tablet Take by mouth.    . Cholecalciferol 25 MCG (1000 UT) capsule 1 capsule 1 (one) time each day    . clonazePAM (KLONOPIN) 0.5 MG tablet clonazepam 0.5 mg tablet    . cloNIDine (CATAPRES) 0.1 MG tablet clonidine HCl 0.1 mg tablet    . cycloSPORINE (RESTASIS) 0.05 % ophthalmic emulsion Restasis 0.05 % eye drops in a dropperette    . Ferrous Gluconate 239 (27 Fe) MG TABS Take by mouth.    . losartan (COZAAR) 100 MG tablet losartan 100 mg tablet    . metoprolol tartrate (LOPRESSOR) 100 MG tablet metoprolol tartrate 100 mg tablet    .  NIFEdipine (PROCARDIA XL/NIFEDICAL XL) 60 MG 24 hr tablet Take 60 mg by mouth daily.    . nitroGLYCERIN (NITROSTAT) 0.4 MG SL tablet nitroglycerin 0.4 mg sublingual tablet    . pantoprazole (PROTONIX) 40 MG tablet TAKE 1 TABLET EVERY DAY BY ORAL ROUTE AS NEEDED.    Marland Kitchen potassium chloride SA (KLOR-CON) 20 MEQ tablet Klor-Con M20 mEq tablet,extended release    . RESTASIS 0.05 % ophthalmic emulsion     . spironolactone (ALDACTONE) 25 MG tablet spironolactone 25 mg tablet    . temazepam (RESTORIL) 15 MG capsule temazepam 15 mg capsule  PRN     No current facility-administered medications for this visit.     PHYSICAL EXAMINATION: ECOG PERFORMANCE STATUS: 1 - Symptomatic but completely ambulatory  Vitals:   08/22/20 1200  BP: 116/68  Pulse: (!) 59  Resp: 17  Temp: 98.7 F (37.1 C)  SpO2: 100%   Filed Weights   08/22/20 1200  Weight: 121 lb 6 oz (55.1 kg)  GENERAL:alert, no distress and comfortable ABDOMEN:abdomen soft, non-tender PSYCH: alert & oriented x 3 with fluent speech NEURO: moves all extremities spontaneously  LABORATORY DATA:  I have reviewed the data as listed Lab Results  Component Value Date   WBC 4.0 08/07/2020   HGB 9.7 (L) 08/07/2020   HCT 30.2 (L) 08/07/2020   MCV 86.8 08/07/2020   PLT 122 (L) 08/07/2020     Chemistry      Component Value Date/Time   NA 136 06/20/2020 1315   K 4.0 06/20/2020 1315   CL 102 06/20/2020 1315   CO2 27 06/20/2020 1315   BUN 23 06/20/2020 1315   CREATININE 1.24 (H) 06/20/2020 1315      Component Value Date/Time   CALCIUM 9.6 06/20/2020 1315   ALKPHOS 31 (L) 06/20/2020 1315   AST 18 06/20/2020 1315   ALT 15 06/20/2020 1315   BILITOT 0.8 06/20/2020 1315      RADIOGRAPHIC STUDIES: I have personally reviewed the radiological images as listed and agreed with the findings in the report.  CT BIOPSY  Result Date: 08/07/2020 INDICATION: 70 year old female with anemia of uncertain etiology. She presents for bone marrow  biopsy. EXAM: CT GUIDED BONE MARROW ASPIRATION AND CORE BIOPSY Interventional Radiologist:  Criselda Peaches, MD MEDICATIONS: None. ANESTHESIA/SEDATION: Moderate (conscious) sedation was employed during this procedure. A total of 1 milligrams versed and 50 micrograms fentanyl were administered intravenously. The patient's level of consciousness and vital signs were monitored continuously by radiology nursing throughout the procedure under my direct supervision. Total monitored sedation time: 10 minutes FLUOROSCOPY TIME:  None COMPLICATIONS: None immediate. Estimated blood loss: <25 mL PROCEDURE: Informed written consent was obtained from the patient after a thorough discussion of the procedural risks, benefits and alternatives. All questions were addressed. Maximal Sterile Barrier Technique was utilized including caps, mask, sterile gowns, sterile gloves, sterile drape, hand hygiene and skin antiseptic. A timeout was performed prior to the initiation of the procedure. The patient was positioned prone and non-contrast localization CT was performed of the pelvis to demonstrate the iliac marrow spaces. Maximal barrier sterile technique utilized including caps, mask, sterile gowns, sterile gloves, large sterile drape, hand hygiene, and betadine prep. Under sterile conditions and local anesthesia, an 11 gauge coaxial bone biopsy needle was advanced into the right iliac marrow space. Needle position was confirmed with CT imaging. Initially, bone marrow aspiration was performed. Next, the 11 gauge outer cannula was utilized to obtain a right iliac bone marrow core biopsy. Needle was removed. Hemostasis was obtained with compression. The patient tolerated the procedure well. Samples were prepared with the cytotechnologist. IMPRESSION: Technically successful CT-guided right iliac bone marrow aspiration and core biopsy. Electronically Signed   By: Jacqulynn Cadet M.D.   On: 08/07/2020 12:28   CT BONE MARROW BIOPSY &  ASPIRATION  Result Date: 08/07/2020 Criselda Peaches, MD     08/07/2020 11:35 AM Interventional Radiology Procedure Note Procedure: CT guided aspirate and core biopsy of right iliac bone Complications: None Recommendations: - Bedrest supine x 1 hrs - Hydrocodone PRN  Pain - Follow biopsy results Signed, Criselda Peaches, MD    All questions were answered. The patient knows to call the clinic with any problems, questions or concerns.  I spent 30 minutes in the care of this patient including History, review of records, counseling and coordination of care.     Benay Pike, MD 08/22/2020 12:14 PM

## 2020-09-17 ENCOUNTER — Telehealth (HOSPITAL_COMMUNITY): Payer: Self-pay | Admitting: *Deleted

## 2020-09-17 NOTE — Telephone Encounter (Signed)
Patient called requesting clarification on upcoming appointment and requested LOV note be forwarded to Dr. Wandra Scot, along with Bone marrow bx report.  Routed to provider and patient aware.

## 2020-10-20 ENCOUNTER — Ambulatory Visit (HOSPITAL_COMMUNITY): Payer: Medicare HMO | Admitting: Hematology

## 2020-10-20 ENCOUNTER — Other Ambulatory Visit (HOSPITAL_COMMUNITY): Payer: Medicare HMO

## 2020-11-03 ENCOUNTER — Ambulatory Visit (HOSPITAL_COMMUNITY): Payer: Medicare HMO | Admitting: Hematology

## 2020-11-03 ENCOUNTER — Inpatient Hospital Stay (HOSPITAL_COMMUNITY): Payer: Medicare HMO

## 2020-12-08 ENCOUNTER — Ambulatory Visit (HOSPITAL_COMMUNITY): Payer: Medicare HMO | Admitting: Hematology

## 2020-12-08 ENCOUNTER — Other Ambulatory Visit (HOSPITAL_COMMUNITY): Payer: Medicare HMO

## 2020-12-10 ENCOUNTER — Ambulatory Visit (HOSPITAL_COMMUNITY): Payer: Medicare HMO | Admitting: Hematology and Oncology

## 2020-12-10 ENCOUNTER — Other Ambulatory Visit (HOSPITAL_COMMUNITY): Payer: Medicare HMO

## 2021-02-15 NOTE — Progress Notes (Signed)
[place], [place] [address]   CLINIC:  Medical Oncology/Hematology  PCP:  System, Provider Not In None  None  REASON FOR VISIT:  Follow-up for IDA  PRIOR THERAPY: none  CURRENT THERAPY: surveillance  INTERVAL HISTORY:  [name], a 70 y.o. female, returns for routine follow-up for her IDA. Jennifer Li was last seen on 08/22/2020 by Dr. Benay Pike.  Today she reports feeling good. She has never previously had an iron infusion. She takes oral iron tablets BID and denies constipation. She reports cold sensation,  and occasional burning in her legs and arms for the past 3 weeks. She denies tingling and numbness in her hands and feet. She denies any previous blood transfusions, and she denies family history of leukemia. Polycythemia, and sickle cell disease. She is currently taking 1000 mg of Vitamin B-12. She denies any weight loss in the past 6 months.Her left leg has been chronically swollen.   REVIEW OF SYSTEMS:  Review of Systems  Constitutional:  Negative for appetite change and fatigue.  Cardiovascular:  Positive for leg swelling (L).  Gastrointestinal:  Negative for constipation.  Neurological:  Positive for numbness (burning in legs and arms).  All other systems reviewed and are negative.  PAST MEDICAL/SURGICAL HISTORY:  Past Medical History:  Diagnosis Date   Arthritis    Cataract    Chronic kidney disease    stage 3   Heart murmur    Past Surgical History:  Procedure Laterality Date   ABDOMINAL HYSTERECTOMY     CATARACT EXTRACTION, BILATERAL     gaglion cyst removal     MYOMECTOMY      SOCIAL HISTORY:  Social History   Socioeconomic History   Marital status: Widowed    Spouse name: Not on file   Number of children: Not on file   Years of education: Not on file   Highest education level: Not on file  Occupational History   Not on file  Tobacco Use   Smoking status: Former   Smokeless tobacco: Never   Substance and Sexual Activity   Alcohol use: Never   Drug use: Never   Sexual activity: Not Currently  Other Topics Concern   Not on file  Social History Narrative   Not on file   Social Determinants of Health   Financial Resource Strain: Low Risk    Difficulty of Paying Living Expenses: Not hard at all  Food Insecurity: Food Insecurity Present   Worried About Charity fundraiser in the Last Year: Sometimes true   Arboriculturist in the Last Year: Sometimes true  Transportation Needs: Unmet Transportation Needs   Lack of Transportation (Medical): Yes   Lack of Transportation (Non-Medical): No  Physical Activity: Not on file  Stress: No Stress Concern Present   Feeling of Stress : Not at all  Social Connections: Moderately Integrated   Frequency of Communication with Friends and Family: More than three times a week   Frequency of Social Gatherings with Friends and Family: Once a week   Attends Religious Services: More than 4 times per year   Active Member of [employer] or Organizations: Yes   Attends Archivist Meetings: Never   Marital Status: Widowed  Human resources officer Violence: Not At Risk   Fear of Current or Ex-Partner: No   Emotionally Abused: No   Physically Abused: No   Sexually Abused: No    FAMILY HISTORY:  Family History  Problem Relation  Age of Onset   Diabetes Sister    Diabetes Son     CURRENT MEDICATIONS:  Current Outpatient Medications  Medication Sig Dispense Refill   ascorbic acid (VITAMIN C) 1000 MG tablet Take by mouth.     cholecalciferol (VITAMIN D3) 25 MCG (1000 UNIT) tablet Take 1,000 Units by mouth daily.     Cholecalciferol 25 MCG (1000 UT) capsule 1 capsule 1 (one) time each day     clonazePAM (KLONOPIN) 0.5 MG tablet clonazepam 0.5 mg tablet     cloNIDine (CATAPRES) 0.1 MG tablet clonidine HCl 0.1 mg tablet     Ferrous Gluconate 239 (27 Fe) MG TABS Take by mouth.     losartan (COZAAR) 100 MG tablet losartan 100 mg tablet      metoprolol tartrate (LOPRESSOR) 100 MG tablet metoprolol tartrate 100 mg tablet     nitroGLYCERIN (NITROSTAT) 0.4 MG SL tablet nitroglycerin 0.4 mg sublingual tablet     pantoprazole (PROTONIX) 40 MG tablet TAKE 1 TABLET EVERY DAY BY ORAL ROUTE AS NEEDED.     spironolactone (ALDACTONE) 25 MG tablet spironolactone 25 mg tablet     temazepam (RESTORIL) 15 MG capsule temazepam 15 mg capsule  PRN     No current facility-administered medications for this visit.    ALLERGIES:  Allergies  Allergen Reactions   Escitalopram    Gemfibrozil Hives and Other (See Comments)   Sulfa Antibiotics Hives   Trazodone     PHYSICAL EXAM:  Performance status (ECOG): 1 - Symptomatic but completely ambulatory  Vitals:   02/16/21 1403  BP: (!) 155/81  Pulse: 62  Resp: 16  Temp: (!) 96.9 F (36.1 C)  SpO2: 100%   Wt Readings from Last 3 Encounters:  02/16/21 122 lb 11.2 oz (55.7 kg)  08/22/20 121 lb 6 oz (55.1 kg)  07/18/20 119 lb 4 oz (54.1 kg)   Physical Exam Vitals reviewed.  Constitutional:      Appearance: Normal appearance.  Cardiovascular:     Rate and Rhythm: Normal rate and regular rhythm.     Pulses: Normal pulses.     Heart sounds: Normal heart sounds.  Pulmonary:     Effort: Pulmonary effort is normal.     Breath sounds: Normal breath sounds.  Abdominal:     Palpations: Abdomen is soft. There is no hepatomegaly, splenomegaly or mass.     Tenderness: There is no abdominal tenderness.  Musculoskeletal:     Right lower leg: No edema.     Left lower leg: No edema.  Lymphadenopathy:     Cervical: No cervical adenopathy.     Right cervical: No superficial cervical adenopathy.    Left cervical: No superficial cervical adenopathy.     Upper Body:     Right upper body: No supraclavicular, axillary or pectoral adenopathy.     Left upper body: No supraclavicular, axillary or pectoral adenopathy.     Lower Body: No right inguinal adenopathy. No left inguinal adenopathy.   Neurological:     General: No focal deficit present.     Mental Status: She is alert and oriented to person, place, and time.  Psychiatric:        Mood and Affect: Mood normal.        Behavior: Behavior normal.    LABORATORY DATA:  I have reviewed the labs as listed.  CBC Latest Ref Rng & Units 02/16/2021 08/07/2020 06/20/2020  WBC 4.0 - 10.5 K/uL 3.6(L) 4.0 3.4(L)  Hemoglobin 12.0 - 15.0 g/dL 10.4(L) 9.7(L)  9.5(L)  Hematocrit 36.0 - 46.0 % 32.0(L) 30.2(L) 29.9(L)  Platelets 150 - 400 K/uL 158 122(L) 152   CMP Latest Ref Rng & Units 02/16/2021 06/20/2020  Glucose 70 - 99 mg/dL 103(H) 99  BUN 8 - 23 mg/dL 32(H) 23  Creatinine 0.44 - 1.00 mg/dL 1.46(H) 1.24(H)  Sodium 135 - 145 mmol/L 140 136  Potassium 3.5 - 5.1 mmol/L 3.8 4.0  Chloride 98 - 111 mmol/L 108 102  CO2 22 - 32 mmol/L 27 27  Calcium 8.9 - 10.3 mg/dL 9.1 9.6  Total Protein 6.5 - 8.1 g/dL 7.0 7.0  Total Bilirubin 0.3 - 1.2 mg/dL 1.2 0.8  Alkaline Phos 38 - 126 U/L 37(L) 31(L)  AST 15 - 41 U/L 19 18  ALT 0 - 44 U/L 13 15      Component Value Date/Time   RBC 3.59 (L) 02/16/2021 1251   MCV 89.1 02/16/2021 1251   MCH 29.0 02/16/2021 1251   MCHC 32.5 02/16/2021 1251   RDW 14.7 02/16/2021 1251   LYMPHSABS 1.3 02/16/2021 1251   MONOABS 0.2 02/16/2021 1251   EOSABS 0.1 02/16/2021 1251   BASOSABS 0.0 02/16/2021 1251    DIAGNOSTIC IMAGING:  I have independently reviewed the scans and discussed with the patient. No results found.   ASSESSMENT:  Normocytic normochromic anemia: - EGD and colonoscopy in March 2021 by Dr. Phillip Heal showed mild gastritis, diverticulosis, internal and external hemorrhoids, 1 polyp which was noncancerous. - Capsule endoscopy in September 2021 - Bone marrow biopsy on 08/07/2020, normocellular marrow with trilineage hematopoiesis.  Mild polytypic plasmacytosis.  Chromosome analysis was normal.  SPEP was negative.  Free light chain ratio was normal with elevated kappa light chains consistent with  CKD. - No prior history of blood transfusions.  No family history of sickle cell or thalassemia.  HTN: - She is on losartan 100 mg, Catapres 0.1 mg, Aldactone 25 mg and Lopressor 100 mg  CKD and nephrolithiasis: - She follows up with Dr. Elita Quick in [place].   PLAN:  Normocytic normochromic anemia: - She is taking iron tablet twice daily without any major problems since the beginning of the year. - She reports feeling cold which starts in the feet and spreads up.  She has burning sensation when she starts feeling cold.  This started about last 2 to 3 months back.  Denies any tingling or numbness in extremities.  She has occasional bleeding per rectum from hemorrhoids. - Reviewed labs from today.  Ferritin is 175 and percent saturation is 14.  Hemoglobin is 10.4. - Etiology of anemia is from CKD and relative iron deficiency. - Recommend a trial of parenteral iron therapy.  We talked about side effects of Venofer in detail.  She will receive 3 infusions. - She will return to clinic in 3 months with a repeat ferritin, iron panel and CBC.  We will also check S28, folic acid, methylmalonic acid, copper and TSH at that time.  Stage III CKD: - Creatinine today is 1.46 and close to her baseline. - Continue follow-up with Dr. Elita Quick.  Orders placed this encounter:  No orders of the defined types were placed in this encounter.    Derek Jack, MD [place] [phone]   I, Thana Ates, am acting as a scribe for Dr. Derek Jack.  I, Derek Jack MD, have reviewed the above documentation for accuracy and completeness, and I agree with the above.

## 2021-02-16 ENCOUNTER — Other Ambulatory Visit: Payer: Self-pay

## 2021-02-16 ENCOUNTER — Inpatient Hospital Stay (HOSPITAL_COMMUNITY): Payer: Medicare HMO | Attending: Hematology | Admitting: Hematology

## 2021-02-16 ENCOUNTER — Inpatient Hospital Stay (HOSPITAL_COMMUNITY): Payer: Medicare HMO

## 2021-02-16 VITALS — BP 155/81 | HR 62 | Temp 96.9°F | Resp 16 | Wt 122.7 lb

## 2021-02-16 DIAGNOSIS — D649 Anemia, unspecified: Secondary | ICD-10-CM | POA: Diagnosis not present

## 2021-02-16 DIAGNOSIS — D509 Iron deficiency anemia, unspecified: Secondary | ICD-10-CM | POA: Diagnosis not present

## 2021-02-16 LAB — COMPREHENSIVE METABOLIC PANEL
ALT: 13 U/L (ref 0–44)
AST: 19 U/L (ref 15–41)
Albumin: 4.1 g/dL (ref 3.5–5.0)
Alkaline Phosphatase: 37 U/L — ABNORMAL LOW (ref 38–126)
Anion gap: 5 (ref 5–15)
BUN: 32 mg/dL — ABNORMAL HIGH (ref 8–23)
CO2: 27 mmol/L (ref 22–32)
Calcium: 9.1 mg/dL (ref 8.9–10.3)
Chloride: 108 mmol/L (ref 98–111)
Creatinine, Ser: 1.46 mg/dL — ABNORMAL HIGH (ref 0.44–1.00)
GFR, Estimated: 38 mL/min — ABNORMAL LOW (ref >60)
Glucose, Bld: 103 mg/dL — ABNORMAL HIGH (ref 70–99)
Potassium: 3.8 mmol/L (ref 3.5–5.1)
Sodium: 140 mmol/L (ref 135–145)
Total Bilirubin: 1.2 mg/dL (ref 0.3–1.2)
Total Protein: 7 g/dL (ref 6.5–8.1)

## 2021-02-16 LAB — CBC WITH DIFFERENTIAL/PLATELET
Abs Immature Granulocytes: 0.01 10*3/uL (ref 0.00–0.07)
Basophils Absolute: 0 10*3/uL (ref 0.0–0.1)
Basophils Relative: 1 %
Eosinophils Absolute: 0.1 10*3/uL (ref 0.0–0.5)
Eosinophils Relative: 2 %
HCT: 32 % — ABNORMAL LOW (ref 36.0–46.0)
Hemoglobin: 10.4 g/dL — ABNORMAL LOW (ref 12.0–15.0)
Immature Granulocytes: 0 %
Lymphocytes Relative: 36 %
Lymphs Abs: 1.3 10*3/uL (ref 0.7–4.0)
MCH: 29 pg (ref 26.0–34.0)
MCHC: 32.5 g/dL (ref 30.0–36.0)
MCV: 89.1 fL (ref 80.0–100.0)
Monocytes Absolute: 0.2 10*3/uL (ref 0.1–1.0)
Monocytes Relative: 6 %
Neutro Abs: 2 10*3/uL (ref 1.7–7.7)
Neutrophils Relative %: 55 %
Platelets: 158 10*3/uL (ref 150–400)
RBC: 3.59 MIL/uL — ABNORMAL LOW (ref 3.87–5.11)
RDW: 14.7 % (ref 11.5–15.5)
WBC: 3.6 10*3/uL — ABNORMAL LOW (ref 4.0–10.5)
nRBC: 0 % (ref 0.0–0.2)

## 2021-02-16 LAB — IRON AND TIBC
Iron: 42 ug/dL (ref 28–170)
Saturation Ratios: 14 % (ref 10.4–31.8)
TIBC: 305 ug/dL (ref 250–450)
UIBC: 263 ug/dL

## 2021-02-16 LAB — FERRITIN: Ferritin: 175 ng/mL (ref 11–307)

## 2021-02-16 NOTE — Patient Instructions (Addendum)
[facility] at [facility] Discharge Instructions  You were seen today by Dr. Ellin Saba. He went over your recent results. You will be scheduled to received 3 infusions of IV Iron (Venofer). Dr. Ellin Saba will see you back in 3 months for labs and follow up.   Thank you for choosing [facility] at [facility] to provide your oncology and hematology care.  To afford each patient quality time with our provider, please arrive at least 15 minutes before your scheduled appointment time.   If you have a lab appointment with the Cancer Center please come in thru the Main Entrance and check in at the main information desk  You need to re-schedule your appointment should you arrive 10 or more minutes late.  We strive to give you quality time with our providers, and arriving late affects you and other patients whose appointments are after yours.  Also, if you no show three or more times for appointments you may be dismissed from the clinic at the providers discretion.     Again, thank you for choosing [facility].  Our hope is that these requests will decrease the amount of time that you wait before being seen by our physicians.       _____________________________________________________________  Should you have questions after your visit to [facility], please contact our office at [phone] between the hours of 8:00 a.m. and 4:30 p.m.  Voicemails left after 4:00 p.m. will not be returned until the following business day.  For prescription refill requests, have your pharmacy contact our office and allow 72 hours.    Cancer Center Support Programs:   > Cancer Support Group  2nd Tuesday of the month 1pm-2pm, Journey Room

## 2021-02-23 ENCOUNTER — Other Ambulatory Visit: Payer: Self-pay

## 2021-02-23 ENCOUNTER — Inpatient Hospital Stay (HOSPITAL_COMMUNITY): Payer: Medicare HMO

## 2021-02-23 VITALS — BP 145/77 | HR 56 | Temp 96.7°F | Resp 16

## 2021-02-23 DIAGNOSIS — D509 Iron deficiency anemia, unspecified: Secondary | ICD-10-CM | POA: Diagnosis not present

## 2021-02-23 DIAGNOSIS — D649 Anemia, unspecified: Secondary | ICD-10-CM

## 2021-02-23 MED ORDER — IRON SUCROSE 20 MG/ML IV SOLN
300.0000 mg | Freq: Once | INTRAVENOUS | Status: AC
  Administered 2021-02-23: 300 mg via INTRAVENOUS
  Filled 2021-02-23: qty 300

## 2021-02-23 MED ORDER — FAMOTIDINE 20 MG PO TABS
20.0000 mg | ORAL_TABLET | Freq: Once | ORAL | Status: AC
  Administered 2021-02-23: 20 mg via ORAL
  Filled 2021-02-23: qty 1

## 2021-02-23 MED ORDER — ACETAMINOPHEN 325 MG PO TABS
650.0000 mg | ORAL_TABLET | Freq: Once | ORAL | Status: AC
  Administered 2021-02-23: 650 mg via ORAL
  Filled 2021-02-23: qty 2

## 2021-02-23 MED ORDER — LORATADINE 10 MG PO TABS
10.0000 mg | ORAL_TABLET | Freq: Once | ORAL | Status: AC
  Administered 2021-02-23: 10 mg via ORAL
  Filled 2021-02-23: qty 1

## 2021-02-23 MED ORDER — SODIUM CHLORIDE 0.9 % IV SOLN: Freq: Once | INTRAVENOUS | Status: AC

## 2021-02-23 NOTE — Patient Instructions (Signed)
[facility]  Discharge Instructions: Thank you for choosing [facility] to provide your oncology and hematology care.  If you have a lab appointment with the Cancer Center, please come in thru the Main Entrance and check in at the main information desk.  Wear comfortable clothing and clothing appropriate for easy access to any Portacath or PICC line.   We strive to give you quality time with your provider. You may need to reschedule your appointment if you arrive late (15 or more minutes).  Arriving late affects you and other patients whose appointments are after yours.  Also, if you miss three or more appointments without notifying the office, you may be dismissed from the clinic at the provider's discretion.      For prescription refill requests, have your pharmacy contact our office and allow 72 hours for refills to be completed.    Today you received Venofer IV iron infusion.     BELOW ARE SYMPTOMS THAT SHOULD BE REPORTED IMMEDIATELY: *FEVER GREATER THAN 100.4 F (38 C) OR HIGHER *CHILLS OR SWEATING *NAUSEA AND VOMITING THAT IS NOT CONTROLLED WITH YOUR NAUSEA MEDICATION *UNUSUAL SHORTNESS OF BREATH *UNUSUAL BRUISING OR BLEEDING *URINARY PROBLEMS (pain or burning when urinating, or frequent urination) *BOWEL PROBLEMS (unusual diarrhea, constipation, pain near the anus) TENDERNESS IN MOUTH AND THROAT WITH OR WITHOUT PRESENCE OF ULCERS (sore throat, sores in mouth, or a toothache) UNUSUAL RASH, SWELLING OR PAIN  UNUSUAL VAGINAL DISCHARGE OR ITCHING   Items with * indicate a potential emergency and should be followed up as soon as possible or go to the Emergency Department if any problems should occur.  Please show the CHEMOTHERAPY ALERT CARD or IMMUNOTHERAPY ALERT CARD at check-in to the Emergency Department and triage nurse.  Should you have questions after your visit or need to cancel or reschedule your appointment, please contact [facility]  [phone]  and follow the prompts.  Office hours are 8:00 a.m. to 4:30 p.m. Monday - Friday. Please note that voicemails left after 4:00 p.m. may not be returned until the following business day.  We are closed weekends and major holidays. You have access to a nurse at all times for urgent questions. Please call the main number to the clinic [phone] and follow the prompts.  For any non-urgent questions, you may also contact your provider using MyChart. We now offer e-Visits for anyone 18 and older to request care online for non-urgent symptoms. For details visit [email or web address].   Also download the MyChart app! Go to the app store, search "MyChart", open the app, select [place], and log in with your MyChart username and password.  Due to Covid, a mask is required upon entering the hospital/clinic. If you do not have a mask, one will be given to you upon arrival. For doctor visits, patients may have 1 support person aged 18 or older with them. For treatment visits, patients cannot have anyone with them due to current Covid guidelines and our immunocompromised population.  

## 2021-02-23 NOTE — Progress Notes (Signed)
Pt presents today for Venofer IV iron infusion per provider's order. Vital signs stable and pt voiced no new complaints.  Peripheral IV started with good blood return pre and post infusion.  Venofer given today per MD orders. Tolerated infusion without adverse affects. Vital signs stable. No complaints at this time. Discharged from clinic ambulatory in stable condition. Alert and oriented x 3. F/U with [facility] as scheduled.

## 2021-02-25 ENCOUNTER — Encounter (HOSPITAL_COMMUNITY): Payer: Self-pay

## 2021-02-25 NOTE — Progress Notes (Signed)
PCP updated to Ardath Sax, MD per patient request

## 2021-03-02 ENCOUNTER — Encounter (HOSPITAL_COMMUNITY): Payer: Self-pay

## 2021-03-02 ENCOUNTER — Inpatient Hospital Stay (HOSPITAL_COMMUNITY): Payer: Medicare HMO | Attending: Hematology

## 2021-03-02 ENCOUNTER — Other Ambulatory Visit: Payer: Self-pay

## 2021-03-02 VITALS — BP 139/74 | HR 65 | Temp 96.9°F | Resp 17

## 2021-03-02 DIAGNOSIS — D509 Iron deficiency anemia, unspecified: Secondary | ICD-10-CM | POA: Diagnosis not present

## 2021-03-02 DIAGNOSIS — D649 Anemia, unspecified: Secondary | ICD-10-CM

## 2021-03-02 MED ORDER — ACETAMINOPHEN 325 MG PO TABS
650.0000 mg | ORAL_TABLET | Freq: Once | ORAL | Status: AC
  Administered 2021-03-02: 650 mg via ORAL
  Filled 2021-03-02: qty 2

## 2021-03-02 MED ORDER — SODIUM CHLORIDE 0.9 % IV SOLN
300.0000 mg | Freq: Once | INTRAVENOUS | Status: AC
  Administered 2021-03-02: 300 mg via INTRAVENOUS
  Filled 2021-03-02: qty 300

## 2021-03-02 MED ORDER — SODIUM CHLORIDE 0.9 % IV SOLN: Freq: Once | INTRAVENOUS | Status: AC

## 2021-03-02 MED ORDER — FAMOTIDINE 20 MG PO TABS
20.0000 mg | ORAL_TABLET | Freq: Once | ORAL | Status: AC
  Administered 2021-03-02: 20 mg via ORAL
  Filled 2021-03-02: qty 1

## 2021-03-02 MED ORDER — LORATADINE 10 MG PO TABS
10.0000 mg | ORAL_TABLET | Freq: Once | ORAL | Status: AC
  Administered 2021-03-02: 10 mg via ORAL
  Filled 2021-03-02: qty 1

## 2021-03-02 NOTE — Patient Instructions (Signed)
[facility]  Discharge Instructions: Thank you for choosing [facility] to provide your oncology and hematology care.  If you have a lab appointment with the Cancer Center, please come in thru the Main Entrance and check in at the main information desk.  Wear comfortable clothing and clothing appropriate for easy access to any Portacath or PICC line.   We strive to give you quality time with your provider. You may need to reschedule your appointment if you arrive late (15 or more minutes).  Arriving late affects you and other patients whose appointments are after yours.  Also, if you miss three or more appointments without notifying the office, you may be dismissed from the clinic at the provider's discretion.      For prescription refill requests, have your pharmacy contact our office and allow 72 hours for refills to be completed.    Today you received the following: Venofer, return as scheduled.   To help prevent nausea and vomiting after your treatment, we encourage you to take your nausea medication as directed.  BELOW ARE SYMPTOMS THAT SHOULD BE REPORTED IMMEDIATELY: *FEVER GREATER THAN 100.4 F (38 C) OR HIGHER *CHILLS OR SWEATING *NAUSEA AND VOMITING THAT IS NOT CONTROLLED WITH YOUR NAUSEA MEDICATION *UNUSUAL SHORTNESS OF BREATH *UNUSUAL BRUISING OR BLEEDING *URINARY PROBLEMS (pain or burning when urinating, or frequent urination) *BOWEL PROBLEMS (unusual diarrhea, constipation, pain near the anus) TENDERNESS IN MOUTH AND THROAT WITH OR WITHOUT PRESENCE OF ULCERS (sore throat, sores in mouth, or a toothache) UNUSUAL RASH, SWELLING OR PAIN  UNUSUAL VAGINAL DISCHARGE OR ITCHING   Items with * indicate a potential emergency and should be followed up as soon as possible or go to the Emergency Department if any problems should occur.  Please show the CHEMOTHERAPY ALERT CARD or IMMUNOTHERAPY ALERT CARD at check-in to the Emergency Department and triage  nurse.  Should you have questions after your visit or need to cancel or reschedule your appointment, please contact [facility] [phone]  and follow the prompts.  Office hours are 8:00 a.m. to 4:30 p.m. Monday - Friday. Please note that voicemails left after 4:00 p.m. may not be returned until the following business day.  We are closed weekends and major holidays. You have access to a nurse at all times for urgent questions. Please call the main number to the clinic [phone] and follow the prompts.  For any non-urgent questions, you may also contact your provider using MyChart. We now offer e-Visits for anyone 18 and older to request care online for non-urgent symptoms. For details visit [email or web address].   Also download the MyChart app! Go to the app store, search "MyChart", open the app, select [facility], and log in with your MyChart username and password.  Due to Covid, a mask is required upon entering the hospital/clinic. If you do not have a mask, one will be given to you upon arrival. For doctor visits, patients may have 1 support person aged 18 or older with them. For treatment visits, patients cannot have anyone with them due to current Covid guidelines and our immunocompromised population.  

## 2021-03-02 NOTE — Progress Notes (Signed)
Patient tolerated iron infusion with no complaints voiced.  Peripheral IV site clean and dry with good blood return noted before and after infusion.  Band aid applied.  VSS with discharge and left in satisfactory condition with no s/s of distress noted.   

## 2021-03-09 ENCOUNTER — Other Ambulatory Visit: Payer: Self-pay

## 2021-03-09 ENCOUNTER — Encounter (HOSPITAL_COMMUNITY): Payer: Self-pay

## 2021-03-09 ENCOUNTER — Inpatient Hospital Stay (HOSPITAL_COMMUNITY): Payer: Medicare HMO

## 2021-03-09 VITALS — BP 126/65 | HR 61 | Temp 96.1°F | Resp 18

## 2021-03-09 DIAGNOSIS — D509 Iron deficiency anemia, unspecified: Secondary | ICD-10-CM | POA: Diagnosis not present

## 2021-03-09 DIAGNOSIS — D649 Anemia, unspecified: Secondary | ICD-10-CM

## 2021-03-09 MED ORDER — SODIUM CHLORIDE 0.9 % IV SOLN
Freq: Once | INTRAVENOUS | Status: AC
Start: 2021-03-09 — End: 2021-03-09

## 2021-03-09 MED ORDER — SODIUM CHLORIDE 0.9 % IV SOLN
400.0000 mg | Freq: Once | INTRAVENOUS | Status: AC
  Administered 2021-03-09: 400 mg via INTRAVENOUS
  Filled 2021-03-09: qty 20

## 2021-03-09 MED ORDER — ACETAMINOPHEN 325 MG PO TABS
650.0000 mg | ORAL_TABLET | Freq: Once | ORAL | Status: AC
  Administered 2021-03-09: 650 mg via ORAL
  Filled 2021-03-09: qty 2

## 2021-03-09 MED ORDER — FAMOTIDINE 20 MG PO TABS
20.0000 mg | ORAL_TABLET | Freq: Once | ORAL | Status: AC
  Administered 2021-03-09: 20 mg via ORAL
  Filled 2021-03-09: qty 1

## 2021-03-09 MED ORDER — SODIUM CHLORIDE 0.9 % IV SOLN: 300.0000 mg | Freq: Once | INTRAVENOUS | Status: DC

## 2021-03-09 MED ORDER — LORATADINE 10 MG PO TABS
10.0000 mg | ORAL_TABLET | Freq: Once | ORAL | Status: AC
  Administered 2021-03-09: 10 mg via ORAL
  Filled 2021-03-09: qty 1

## 2021-03-09 NOTE — Progress Notes (Signed)
Patient tolerated iron infusion with no complaints voiced. Peripheral IV site clean and dry with good blood return noted before and after infusion. RN attempted to hold pressure on IV site after IV removal however patient refused, patient educated on the importance of holding pressure on hand to prevent bleeding and swelling. Band aid applied. VSS with discharge. Patient concerned with bruising of hand after IV removal. RN assessed IV site and noted bruising and swelling after IV removal. Patient educated on the importance of holding pressure on site and to use warm compresses to decrease swelling once she got home. Patient given warm compress by RN but patient stuck it in her purse. Patient left in satisfactory condition with no s/s of distress noted.

## 2021-03-09 NOTE — Progress Notes (Signed)
Venofer 400 mg IVPB is the 3rd dose of Venofer for Ms Kozak.  Orders updated to reflect dose.  T.O. Dr Carilyn Goodpasture, PharmD

## 2021-03-09 NOTE — Patient Instructions (Signed)
[facility]  Discharge Instructions: Thank you for choosing [facility] to provide your oncology and hematology care.  If you have a lab appointment with the Cancer Center, please come in thru the Main Entrance and check in at the main information desk.  Wear comfortable clothing and clothing appropriate for easy access to any Portacath or PICC line.   We strive to give you quality time with your provider. You may need to reschedule your appointment if you arrive late (15 or more minutes).  Arriving late affects you and other patients whose appointments are after yours.  Also, if you miss three or more appointments without notifying the office, you may be dismissed from the clinic at the provider's discretion.      For prescription refill requests, have your pharmacy contact our office and allow 72 hours for refills to be completed.    Today you received the following: Venofer, return as scheduled.   To help prevent nausea and vomiting after your treatment, we encourage you to take your nausea medication as directed.  BELOW ARE SYMPTOMS THAT SHOULD BE REPORTED IMMEDIATELY: *FEVER GREATER THAN 100.4 F (38 C) OR HIGHER *CHILLS OR SWEATING *NAUSEA AND VOMITING THAT IS NOT CONTROLLED WITH YOUR NAUSEA MEDICATION *UNUSUAL SHORTNESS OF BREATH *UNUSUAL BRUISING OR BLEEDING *URINARY PROBLEMS (pain or burning when urinating, or frequent urination) *BOWEL PROBLEMS (unusual diarrhea, constipation, pain near the anus) TENDERNESS IN MOUTH AND THROAT WITH OR WITHOUT PRESENCE OF ULCERS (sore throat, sores in mouth, or a toothache) UNUSUAL RASH, SWELLING OR PAIN  UNUSUAL VAGINAL DISCHARGE OR ITCHING   Items with * indicate a potential emergency and should be followed up as soon as possible or go to the Emergency Department if any problems should occur.  Please show the CHEMOTHERAPY ALERT CARD or IMMUNOTHERAPY ALERT CARD at check-in to the Emergency Department and triage  nurse.  Should you have questions after your visit or need to cancel or reschedule your appointment, please contact [facility] [phone]  and follow the prompts.  Office hours are 8:00 a.m. to 4:30 p.m. Monday - Friday. Please note that voicemails left after 4:00 p.m. may not be returned until the following business day.  We are closed weekends and major holidays. You have access to a nurse at all times for urgent questions. Please call the main number to the clinic [phone] and follow the prompts.  For any non-urgent questions, you may also contact your provider using MyChart. We now offer e-Visits for anyone 18 and older to request care online for non-urgent symptoms. For details visit [email or web address].   Also download the MyChart app! Go to the app store, search "MyChart", open the app, select [facility], and log in with your MyChart username and password.  Due to Covid, a mask is required upon entering the hospital/clinic. If you do not have a mask, one will be given to you upon arrival. For doctor visits, patients may have 1 support person aged 18 or older with them. For treatment visits, patients cannot have anyone with them due to current Covid guidelines and our immunocompromised population.  

## 2021-03-12 NOTE — Progress Notes (Signed)
[facility] MEDICAL ONCOLOGY [address] [place] [address] Phone: [phone] Fax: [phone]  SYMPTOMS MANAGEMENT CLINIC PROGRESS NOTE   Jennifer Li [id number] 11/20/1950 70 y.o.    Subjective:  Chief Complaint: Hand pain and swelling  Jennifer Li is managed by Dr. Ellin Saba for iron deficiency anemia.  She was last seen by Dr. Ellin Saba on 02/16/2021.  She received IV Venofer x3, with her last dose of 400 mg administered on 03/09/2021.  Per RN note (03/09/2021), "RN attempted to hold pressure on IV site after IV removal, however patient refused.  Patient educated on the importance of holding pressure on hand to prevent bleeding and swelling... Patient concerned with bruising of hand after IV removal. RN assessed IV site and noted bruising and swelling after IV removal. Patient educated on the importance of holding pressure on site and to use warm compresses to decrease swelling once she got home. Patient given warm compress by RN, but patient stuck it in her purse. Patient left in satisfactory condition with no s/s of distress noted."  Patient returns to clinic today (03/13/2021) due to complaints of right hand pain.  Patient reports that when her IV was inserted on Monday, 03/09/2021, it felt different than it usually did and was accompanied by a stabbing pain in her right hand that shot up her arm.  At the time that she left, her right hand was so tender, that she did not want the nurses to apply any pressure to it.  She reports that she had some right hand swelling and warmth with continued pain that shoots up her arm into her shoulder.  The swelling and warmth have resolved, but she does continue to have pain, although it is slightly improved.  No fever, chills, or drainage/pus.  No lymph node swelling.  Review of Systems:  Review of Systems  Constitutional:  Negative for chills and fever.  Respiratory:  Negative for shortness of breath.   Cardiovascular:   Negative for chest pain.  Gastrointestinal:  Negative for nausea and vomiting.  Musculoskeletal:  Positive for arthralgias.  Psychiatric/Behavioral:  Positive for sleep disturbance.     Past Medical History, Surgical history, Social history, and Family history were reviewed as documented elsewhere in chart, and were updated as appropriate.   Objective:   Physical Exam:  There were no vitals taken for this visit. ECOG: 1  Physical Exam Constitutional:      General: She is not in acute distress.    Appearance: Normal appearance. She is not toxic-appearing.  HENT:     Head: Normocephalic and atraumatic.  Pulmonary:     Effort: Pulmonary effort is normal.  Musculoskeletal:     Right hand: Tenderness present.       Arms:  Neurological:     General: No focal deficit present.     Mental Status: She is alert and oriented to person, place, and time.  Psychiatric:        Mood and Affect: Mood normal.        Behavior: Behavior normal.        Thought Content: Thought content normal.     Assessment & Plan:    1.  Superficial phlebitis of right hand, secondary to IV - Patient had pain after recent IV placement and removal on 03/09/2021, likely exacerbated by not placing pressure on IV site after removal (patient refused) - Symptoms and physical exam are consistent with superficial phlebitis of right hand - Symptoms have been improving - no  edema, redness, or warmth on exam - Patient does continue to have tenderness and prominent ropelike vein on dorsal aspect of right hand. - PLAN: Clinical diagnosis of superficial phlebitis, but without concern for infection or thrombosis at this time.  Patient encouraged to use alternating warm and cold compresses to alleviate symptoms.  Would NOT recommend NSAID medication for alleviation of symptoms due to her underlying kidney function.  She is reassured that this condition should resolve over time.  However, if she develops any arm swelling,  redness, or fever, she should seek immediate medical attention.  All questions were answered. The patient knows to call the clinic with any problems, questions or concerns.  Medical decision making: Low - minor, self-limited problem  Time spent on visit: I spent 10 minutes counseling the patient face to face. The total time spent in the appointment was 15 minutes and more than 50% was on counseling.   Carnella Guadalajara, PA-C  03/13/2021 9:22 AM

## 2021-03-13 ENCOUNTER — Other Ambulatory Visit: Payer: Self-pay

## 2021-03-13 ENCOUNTER — Inpatient Hospital Stay (HOSPITAL_BASED_OUTPATIENT_CLINIC_OR_DEPARTMENT_OTHER): Payer: Medicare HMO | Admitting: Physician Assistant

## 2021-03-13 VITALS — BP 149/82 | HR 59 | Resp 16 | Wt 121.8 lb

## 2021-03-13 DIAGNOSIS — D509 Iron deficiency anemia, unspecified: Secondary | ICD-10-CM | POA: Diagnosis not present

## 2021-03-13 DIAGNOSIS — I809 Phlebitis and thrombophlebitis of unspecified site: Secondary | ICD-10-CM | POA: Diagnosis not present

## 2021-05-17 ENCOUNTER — Encounter (HOSPITAL_COMMUNITY): Payer: Self-pay | Admitting: Hematology

## 2021-05-17 NOTE — Progress Notes (Signed)
[place], [place] [address]   CLINIC:  Medical Oncology/Hematology  PCP:  No primary care provider on file. No primary physician on file.  None  REASON FOR VISIT:  Follow-up for IDA  PRIOR THERAPY: none  CURRENT THERAPY: surveillance  INTERVAL HISTORY:  [name], a 70 y.o. female, returns for routine follow-up for her IDA. Jennifer Li was last seen on 02/16/2021.  Today she reports feeling good. She reports improved energy levels following her iron infusion. She denies fatigue, current bleeding, and black stools. She is taking iron tablets BID. She reports continued cold sensation; this sensation improves after eating. She denies ankle swellings.   REVIEW OF SYSTEMS:  Review of Systems  Constitutional:  Negative for appetite change (50%) and fatigue (50%).  HENT:   Negative for nosebleeds.   Respiratory:  Negative for hemoptysis.   Cardiovascular:  Negative for leg swelling.  Gastrointestinal:  Negative for blood in stool.  Genitourinary:  Negative for hematuria.   Neurological:  Positive for headaches and speech difficulty.  All other systems reviewed and are negative.  PAST MEDICAL/SURGICAL HISTORY:  Past Medical History:  Diagnosis Date   Arthritis    Cataract    Chronic kidney disease    stage 3   Heart murmur    Past Surgical History:  Procedure Laterality Date   ABDOMINAL HYSTERECTOMY     CATARACT EXTRACTION, BILATERAL     gaglion cyst removal     MYOMECTOMY      SOCIAL HISTORY:  Social History   Socioeconomic History   Marital status: Widowed    Spouse name: Not on file   Number of children: Not on file   Years of education: Not on file   Highest education level: Not on file  Occupational History   Not on file  Tobacco Use   Smoking status: Former   Smokeless tobacco: Never  Substance and Sexual Activity   Alcohol use: Never   Drug use: Never   Sexual activity: Not Currently  Other Topics Concern    Not on file  Social History Narrative   Not on file   Social Determinants of Health   Financial Resource Strain: Low Risk    Difficulty of Paying Living Expenses: Not hard at all  Food Insecurity: Food Insecurity Present   Worried About Charity fundraiser in the Last Year: Sometimes true   Arboriculturist in the Last Year: Sometimes true  Transportation Needs: Unmet Transportation Needs   Lack of Transportation (Medical): Yes   Lack of Transportation (Non-Medical): No  Physical Activity: Not on file  Stress: No Stress Concern Present   Feeling of Stress : Not at all  Social Connections: Moderately Integrated   Frequency of Communication with Friends and Family: More than three times a week   Frequency of Social Gatherings with Friends and Family: Once a week   Attends Religious Services: More than 4 times per year   Active Member of [employer] or Organizations: Yes   Attends Archivist Meetings: Never   Marital Status: Widowed  Human resources officer Violence: Not At Risk   Fear of Current or Ex-Partner: No   Emotionally Abused: No   Physically Abused: No   Sexually Abused: No    FAMILY HISTORY:  Family History  Problem Relation Age of Onset   Diabetes Sister    Diabetes Son     CURRENT MEDICATIONS:  Current Outpatient Medications  Medication Sig Dispense  Refill   ascorbic acid (VITAMIN C) 1000 MG tablet Take by mouth.     aspirin EC 81 MG tablet Take 81 mg by mouth daily. Swallow whole.     cholecalciferol (VITAMIN D3) 25 MCG (1000 UNIT) tablet Take 1,000 Units by mouth daily.     Cholecalciferol 25 MCG (1000 UT) capsule 1 capsule 1 (one) time each day     clonazePAM (KLONOPIN) 0.5 MG tablet clonazepam 0.5 mg tablet     cloNIDine (CATAPRES) 0.1 MG tablet clonidine HCl 0.1 mg tablet     cloNIDine (CATAPRES) 0.1 MG tablet Take by mouth.     Ferrous Gluconate 239 (27 Fe) MG TABS Take by mouth.     losartan (COZAAR) 100 MG tablet losartan 100 mg tablet     metoprolol  tartrate (LOPRESSOR) 100 MG tablet metoprolol tartrate 100 mg tablet     nitroGLYCERIN (NITROSTAT) 0.4 MG SL tablet nitroglycerin 0.4 mg sublingual tablet     pantoprazole (PROTONIX) 40 MG tablet TAKE 1 TABLET EVERY DAY BY ORAL ROUTE AS NEEDED.     spironolactone (ALDACTONE) 25 MG tablet spironolactone 25 mg tablet     temazepam (RESTORIL) 15 MG capsule temazepam 15 mg capsule  PRN     triamcinolone ointment (KENALOG) 0.1 % Apply topically 2 (two) times daily.     No current facility-administered medications for this visit.    ALLERGIES:  Allergies  Allergen Reactions   Escitalopram    Gemfibrozil Hives and Other (See Comments)   Sulfa Antibiotics Hives   Sulfur    Trazodone     PHYSICAL EXAM:  Performance status (ECOG): 1 - Symptomatic but completely ambulatory  Vitals:   05/18/21 1506  BP: (!) 147/89  Pulse: 61  Resp: 18  Temp: (!) 97.2 F (36.2 C)  SpO2: 100%   Wt Readings from Last 3 Encounters:  05/18/21 124 lb 8 oz (56.5 kg)  03/13/21 121 lb 12.8 oz (55.2 kg)  02/16/21 122 lb 11.2 oz (55.7 kg)   Physical Exam Vitals reviewed.  Constitutional:      Appearance: Normal appearance.  Cardiovascular:     Rate and Rhythm: Normal rate and regular rhythm.     Pulses: Normal pulses.     Heart sounds: Normal heart sounds.  Pulmonary:     Effort: Pulmonary effort is normal.     Breath sounds: Normal breath sounds.  Neurological:     General: No focal deficit present.     Mental Status: She is alert and oriented to person, place, and time.  Psychiatric:        Mood and Affect: Mood normal.        Behavior: Behavior normal.    LABORATORY DATA:  I have reviewed the labs as listed.  CBC Latest Ref Rng & Units 05/18/2021 02/16/2021 08/07/2020  WBC 4.0 - 10.5 K/uL 3.5(L) 3.6(L) 4.0  Hemoglobin 12.0 - 15.0 g/dL 8.8(L) 10.4(L) 9.7(L)  Hematocrit 36.0 - 46.0 % 27.0(L) 32.0(L) 30.2(L)  Platelets 150 - 400 K/uL 144(L) 158 122(L)   CMP Latest Ref Rng & Units 05/18/2021  02/16/2021 06/20/2020  Glucose 70 - 99 mg/dL 96 103(H) 99  BUN 8 - 23 mg/dL 28(H) 32(H) 23  Creatinine 0.44 - 1.00 mg/dL 1.16(H) 1.46(H) 1.24(H)  Sodium 135 - 145 mmol/L 140 140 136  Potassium 3.5 - 5.1 mmol/L 4.0 3.8 4.0  Chloride 98 - 111 mmol/L 109 108 102  CO2 22 - 32 mmol/L 26 27 27   Calcium 8.9 - 10.3 mg/dL 9.6  9.1 9.6  Total Protein 6.5 - 8.1 g/dL 6.4(L) 7.0 7.0  Total Bilirubin 0.3 - 1.2 mg/dL 0.8 1.2 0.8  Alkaline Phos 38 - 126 U/L 30(L) 37(L) 31(L)  AST 15 - 41 U/L 17 19 18   ALT 0 - 44 U/L 13 13 15       Component Value Date/Time   RBC 2.93 (L) 05/18/2021 1319   MCV 92.2 05/18/2021 1319   MCH 30.0 05/18/2021 1319   MCHC 32.6 05/18/2021 1319   RDW 15.3 05/18/2021 1319   LYMPHSABS 1.4 05/18/2021 1319   MONOABS 0.3 05/18/2021 1319   EOSABS 0.1 05/18/2021 1319   BASOSABS 0.0 05/18/2021 1319    DIAGNOSTIC IMAGING:  I have independently reviewed the scans and discussed with the patient. No results found.   ASSESSMENT:  Normocytic normochromic anemia: - EGD and colonoscopy in March 2021 by Dr. Phillip Heal showed mild gastritis, diverticulosis, internal and external hemorrhoids, 1 polyp which was noncancerous. - Capsule endoscopy in September 2021 - Bone marrow biopsy on 08/07/2020, normocellular marrow with trilineage hematopoiesis.  Mild polytypic plasmacytosis.  Chromosome analysis was normal.  SPEP was negative.  Free light chain ratio was normal with elevated kappa light chains consistent with CKD. - No prior history of blood transfusions.  No family history of sickle cell or thalassemia.   HTN: - She is on losartan 100 mg, Catapres 0.1 mg, Aldactone 25 mg and Lopressor 100 mg   CKD and nephrolithiasis: - She follows up with Dr. Elita Quick in [place].   PLAN:  Normocytic normochromic anemia: - Anemia secondary to CKD and relative iron deficiency. - She received Venofer 3 doses amounting to 1 g from 02/23/2021 through 03/09/2021. - She felt improvement in energy  levels. - She is taking iron tablet twice daily. - She denies any bleeding per rectum or melena. - We reviewed labs from today which showed ferritin 631 and percent saturation 19.  Folic acid was normal.  Hemoglobin has decreased to 8.8 from 10.4 in September.  Creatinine is 1.16.  TSH is 2.1. - We talked about initiating erythropoiesis stimulating agents in the form of Retacrit.  We will start at 15,000 units once every 2 weeks and titrate up as needed.  We discussed side effects in detail including the slight worsening of blood pressure. - She is seeing Dr. Elita Quick tomorrow and will talk to him about blood pressures being high at home occasionally. - We will reevaluate her in 8 weeks with repeat ferritin, iron panel and CBC.  We will also check stool for occult blood.   Stage III CKD: - Creatinine today improved to 1.16.  Previously 1.46. - Continue follow-up with Dr. Elita Quick.  Orders placed this encounter:  No orders of the defined types were placed in this encounter.    Derek Jack, MD [facility] [phone]   I, Thana Ates, am acting as a scribe for Dr. Derek Jack.  I, Derek Jack MD, have reviewed the above documentation for accuracy and completeness, and I agree with the above.

## 2021-05-18 ENCOUNTER — Inpatient Hospital Stay (HOSPITAL_COMMUNITY): Payer: Medicare HMO

## 2021-05-18 ENCOUNTER — Inpatient Hospital Stay (HOSPITAL_COMMUNITY): Payer: Medicare HMO | Attending: Hematology | Admitting: Hematology

## 2021-05-18 ENCOUNTER — Other Ambulatory Visit: Payer: Self-pay

## 2021-05-18 ENCOUNTER — Encounter (HOSPITAL_COMMUNITY): Payer: Self-pay | Admitting: Hematology

## 2021-05-18 VITALS — BP 147/89 | HR 61 | Temp 97.2°F | Resp 18 | Wt 124.5 lb

## 2021-05-18 DIAGNOSIS — Z79899 Other long term (current) drug therapy: Secondary | ICD-10-CM | POA: Diagnosis not present

## 2021-05-18 DIAGNOSIS — N183 Chronic kidney disease, stage 3 unspecified: Secondary | ICD-10-CM | POA: Diagnosis present

## 2021-05-18 DIAGNOSIS — D509 Iron deficiency anemia, unspecified: Secondary | ICD-10-CM | POA: Diagnosis present

## 2021-05-18 DIAGNOSIS — D649 Anemia, unspecified: Secondary | ICD-10-CM

## 2021-05-18 DIAGNOSIS — N200 Calculus of kidney: Secondary | ICD-10-CM | POA: Insufficient documentation

## 2021-05-18 DIAGNOSIS — D631 Anemia in chronic kidney disease: Secondary | ICD-10-CM | POA: Insufficient documentation

## 2021-05-18 DIAGNOSIS — I129 Hypertensive chronic kidney disease with stage 1 through stage 4 chronic kidney disease, or unspecified chronic kidney disease: Secondary | ICD-10-CM | POA: Insufficient documentation

## 2021-05-18 LAB — CBC WITH DIFFERENTIAL/PLATELET
Abs Immature Granulocytes: 0.02 10*3/uL (ref 0.00–0.07)
Basophils Absolute: 0 10*3/uL (ref 0.0–0.1)
Basophils Relative: 1 %
Eosinophils Absolute: 0.1 10*3/uL (ref 0.0–0.5)
Eosinophils Relative: 2 %
HCT: 27 % — ABNORMAL LOW (ref 36.0–46.0)
Hemoglobin: 8.8 g/dL — ABNORMAL LOW (ref 12.0–15.0)
Immature Granulocytes: 1 %
Lymphocytes Relative: 41 %
Lymphs Abs: 1.4 10*3/uL (ref 0.7–4.0)
MCH: 30 pg (ref 26.0–34.0)
MCHC: 32.6 g/dL (ref 30.0–36.0)
MCV: 92.2 fL (ref 80.0–100.0)
Monocytes Absolute: 0.3 10*3/uL (ref 0.1–1.0)
Monocytes Relative: 7 %
Neutro Abs: 1.7 10*3/uL (ref 1.7–7.7)
Neutrophils Relative %: 48 %
Platelets: 144 10*3/uL — ABNORMAL LOW (ref 150–400)
RBC: 2.93 MIL/uL — ABNORMAL LOW (ref 3.87–5.11)
RDW: 15.3 % (ref 11.5–15.5)
WBC: 3.5 10*3/uL — ABNORMAL LOW (ref 4.0–10.5)
nRBC: 0 % (ref 0.0–0.2)

## 2021-05-18 LAB — COMPREHENSIVE METABOLIC PANEL
ALT: 13 U/L (ref 0–44)
AST: 17 U/L (ref 15–41)
Albumin: 3.7 g/dL (ref 3.5–5.0)
Alkaline Phosphatase: 30 U/L — ABNORMAL LOW (ref 38–126)
Anion gap: 5 (ref 5–15)
BUN: 28 mg/dL — ABNORMAL HIGH (ref 8–23)
CO2: 26 mmol/L (ref 22–32)
Calcium: 9.6 mg/dL (ref 8.9–10.3)
Chloride: 109 mmol/L (ref 98–111)
Creatinine, Ser: 1.16 mg/dL — ABNORMAL HIGH (ref 0.44–1.00)
GFR, Estimated: 50 mL/min — ABNORMAL LOW (ref >60)
Glucose, Bld: 96 mg/dL (ref 70–99)
Potassium: 4 mmol/L (ref 3.5–5.1)
Sodium: 140 mmol/L (ref 135–145)
Total Bilirubin: 0.8 mg/dL (ref 0.3–1.2)
Total Protein: 6.4 g/dL — ABNORMAL LOW (ref 6.5–8.1)

## 2021-05-18 LAB — TSH: TSH: 2.101 u[IU]/mL (ref 0.350–4.500)

## 2021-05-18 LAB — IRON AND TIBC
Iron: 51 ug/dL (ref 28–170)
Saturation Ratios: 19 % (ref 10.4–31.8)
TIBC: 262 ug/dL (ref 250–450)
UIBC: 211 ug/dL

## 2021-05-18 LAB — FOLATE: Folate: 21.4 ng/mL (ref >5.9)

## 2021-05-18 LAB — FERRITIN: Ferritin: 631 ng/mL — ABNORMAL HIGH (ref 11–307)

## 2021-05-18 NOTE — Patient Instructions (Addendum)
[facility] Discharge Instructions  You were seen and examined today by Dr. Ellin Saba. He reviewed your most recent labs and everything looks good. Your kidney numbers have improved since your last labs. We will call you back with results of the ferritin and iron panel. Please keep follow up appointment as scheduled.   Thank you for choosing [facility] at [facility] to provide your oncology and hematology care.  To afford each patient quality time with our provider, please arrive at least 15 minutes before your scheduled appointment time.   If you have a lab appointment with the Cancer Center please come in thru the Main Entrance and check in at the main information desk.  You need to re-schedule your appointment should you arrive 10 or more minutes late.  We strive to give you quality time with our providers, and arriving late affects you and other patients whose appointments are after yours.  Also, if you no show three or more times for appointments you may be dismissed from the clinic at the providers discretion.     Again, thank you for choosing [facility].  Our hope is that these requests will decrease the amount of time that you wait before being seen by our physicians.       _____________________________________________________________  Should you have questions after your visit to [facility], please contact our office at [phone] and follow the prompts.  Our office hours are 8:00 a.m. and 4:30 p.m. Monday - Friday.  Please note that voicemails left after 4:00 p.m. may not be returned until the following business day.  We are closed weekends and major holidays.  You do have access to a nurse 24-7, just call the main number to the clinic [phone] and do not press any options, hold on the line and a nurse will answer the phone.    For prescription refill requests, have your pharmacy contact our  office and allow 72 hours.    Due to Covid, you will need to wear a mask upon entering the hospital. If you do not have a mask, a mask will be given to you at the Main Entrance upon arrival. For doctor visits, patients may have 1 support person age 16 or older with them. For treatment visits, patients can not have anyone with them due to social distancing guidelines and our immunocompromised population.

## 2021-05-19 ENCOUNTER — Other Ambulatory Visit (HOSPITAL_COMMUNITY): Payer: Self-pay

## 2021-05-19 DIAGNOSIS — D649 Anemia, unspecified: Secondary | ICD-10-CM

## 2021-05-19 NOTE — Progress Notes (Signed)
Message from Dr. Ellin Saba-  Please call and let her know about her ferritin of 631 and further treatment plan.  No indication for parenteral iron therapy.  I have ordered Retacrit 15,000 units every 2 weeks and titrate up as needed.  Please schedule her for follow-up with me in 2 months with repeat ferritin, iron panel, CBC, stool card for occult blood x3.  Thank you.  Patient aware and agreeable with plan. Sent message to schedulers to get her scheduled for follow-up. Labs and stool card ordered per Dr. Marice Potter order.

## 2021-05-20 LAB — METHYLMALONIC ACID, SERUM: Methylmalonic Acid, Quantitative: 191 nmol/L (ref 0–378)

## 2021-05-21 LAB — COPPER, SERUM: Copper: 114 ug/dL (ref 80–158)

## 2021-05-27 ENCOUNTER — Encounter (HOSPITAL_COMMUNITY): Payer: Self-pay

## 2021-05-27 ENCOUNTER — Other Ambulatory Visit: Payer: Self-pay

## 2021-05-27 ENCOUNTER — Inpatient Hospital Stay (HOSPITAL_COMMUNITY): Payer: Medicare HMO

## 2021-05-27 VITALS — BP 131/79 | HR 71 | Temp 97.7°F | Resp 18

## 2021-05-27 DIAGNOSIS — D509 Iron deficiency anemia, unspecified: Secondary | ICD-10-CM | POA: Diagnosis not present

## 2021-05-27 DIAGNOSIS — D649 Anemia, unspecified: Secondary | ICD-10-CM

## 2021-05-27 LAB — CBC
HCT: 27.7 % — ABNORMAL LOW (ref 36.0–46.0)
Hemoglobin: 8.9 g/dL — ABNORMAL LOW (ref 12.0–15.0)
MCH: 29.8 pg (ref 26.0–34.0)
MCHC: 32.1 g/dL (ref 30.0–36.0)
MCV: 92.6 fL (ref 80.0–100.0)
Platelets: 119 10*3/uL — ABNORMAL LOW (ref 150–400)
RBC: 2.99 MIL/uL — ABNORMAL LOW (ref 3.87–5.11)
RDW: 15.7 % — ABNORMAL HIGH (ref 11.5–15.5)
WBC: 3.6 10*3/uL — ABNORMAL LOW (ref 4.0–10.5)
nRBC: 0 % (ref 0.0–0.2)

## 2021-05-27 LAB — FERRITIN: Ferritin: 521 ng/mL — ABNORMAL HIGH (ref 11–307)

## 2021-05-27 MED ORDER — EPOETIN ALFA-EPBX 10000 UNIT/ML IJ SOLN
15000.0000 [IU] | Freq: Once | INTRAMUSCULAR | Status: AC
  Administered 2021-05-27: 14:00:00 15000 [IU] via SUBCUTANEOUS
  Filled 2021-05-27: qty 2

## 2021-05-27 NOTE — Patient Instructions (Signed)
[facility]  Discharge Instructions: Thank you for choosing [facility] to provide your oncology and hematology care.  If you have a lab appointment with the Cancer Center, please come in thru the Main Entrance and check in at the main information desk.  Wear comfortable clothing and clothing appropriate for easy access to any Portacath or PICC line.   We strive to give you quality time with your provider. You may need to reschedule your appointment if you arrive late (15 or more minutes).  Arriving late affects you and other patients whose appointments are after yours.  Also, if you miss three or more appointments without notifying the office, you may be dismissed from the clinic at the provider's discretion.      For prescription refill requests, have your pharmacy contact our office and allow 72 hours for refills to be completed.        To help prevent nausea and vomiting after your treatment, we encourage you to take your nausea medication as directed.  BELOW ARE SYMPTOMS THAT SHOULD BE REPORTED IMMEDIATELY: *FEVER GREATER THAN 100.4 F (38 C) OR HIGHER *CHILLS OR SWEATING *NAUSEA AND VOMITING THAT IS NOT CONTROLLED WITH YOUR NAUSEA MEDICATION *UNUSUAL SHORTNESS OF BREATH *UNUSUAL BRUISING OR BLEEDING *URINARY PROBLEMS (pain or burning when urinating, or frequent urination) *BOWEL PROBLEMS (unusual diarrhea, constipation, pain near the anus) TENDERNESS IN MOUTH AND THROAT WITH OR WITHOUT PRESENCE OF ULCERS (sore throat, sores in mouth, or a toothache) UNUSUAL RASH, SWELLING OR PAIN  UNUSUAL VAGINAL DISCHARGE OR ITCHING   Items with * indicate a potential emergency and should be followed up as soon as possible or go to the Emergency Department if any problems should occur.  Please show the CHEMOTHERAPY ALERT CARD or IMMUNOTHERAPY ALERT CARD at check-in to the Emergency Department and triage nurse.  Should you have questions after your visit or need to cancel  or reschedule your appointment, please contact [facility] [phone]  and follow the prompts.  Office hours are 8:00 a.m. to 4:30 p.m. Monday - Friday. Please note that voicemails left after 4:00 p.m. may not be returned until the following business day.  We are closed weekends and major holidays. You have access to a nurse at all times for urgent questions. Please call the main number to the clinic [phone] and follow the prompts.  For any non-urgent questions, you may also contact your provider using MyChart. We now offer e-Visits for anyone 18 and older to request care online for non-urgent symptoms. For details visit [email or web address].   Also download the MyChart app! Go to the app store, search "MyChart", open the app, select [facility], and log in with your MyChart username and password.  Due to Covid, a mask is required upon entering the hospital/clinic. If you do not have a mask, one will be given to you upon arrival. For doctor visits, patients may have 1 support person aged 18 or older with them. For treatment visits, patients cannot have anyone with them due to current Covid guidelines and our immunocompromised population.  

## 2021-05-27 NOTE — Progress Notes (Signed)
Patient presents today for Retacrit injection. Hemoglobin reviewed prior to administration. VSS. Injection tolerated without incident or complaint. See MAR for details. Patient stable during and after injection.  Patient discharged in satisfactory condition with no s/s of distress noted.    

## 2021-06-10 ENCOUNTER — Other Ambulatory Visit (HOSPITAL_COMMUNITY): Payer: Medicare HMO

## 2021-06-10 ENCOUNTER — Ambulatory Visit (HOSPITAL_COMMUNITY): Payer: Medicare HMO

## 2021-06-11 ENCOUNTER — Inpatient Hospital Stay (HOSPITAL_COMMUNITY): Payer: Medicare HMO | Attending: Hematology

## 2021-06-11 ENCOUNTER — Other Ambulatory Visit (HOSPITAL_COMMUNITY): Payer: Self-pay

## 2021-06-11 ENCOUNTER — Other Ambulatory Visit: Payer: Self-pay

## 2021-06-11 ENCOUNTER — Inpatient Hospital Stay (HOSPITAL_COMMUNITY): Payer: Medicare HMO

## 2021-06-11 VITALS — BP 138/69 | HR 64 | Temp 96.7°F | Resp 18

## 2021-06-11 DIAGNOSIS — I808 Phlebitis and thrombophlebitis of other sites: Secondary | ICD-10-CM | POA: Insufficient documentation

## 2021-06-11 DIAGNOSIS — D649 Anemia, unspecified: Secondary | ICD-10-CM

## 2021-06-11 DIAGNOSIS — D631 Anemia in chronic kidney disease: Secondary | ICD-10-CM | POA: Diagnosis present

## 2021-06-11 DIAGNOSIS — N183 Chronic kidney disease, stage 3 unspecified: Secondary | ICD-10-CM | POA: Insufficient documentation

## 2021-06-11 LAB — PROTIME-INR
INR: 1.1 (ref 0.8–1.2)
Prothrombin Time: 14 seconds (ref 11.4–15.2)

## 2021-06-11 LAB — CBC WITH DIFFERENTIAL/PLATELET
Abs Immature Granulocytes: 0.06 10*3/uL (ref 0.00–0.07)
Basophils Absolute: 0 10*3/uL (ref 0.0–0.1)
Basophils Relative: 1 %
Eosinophils Absolute: 0.1 10*3/uL (ref 0.0–0.5)
Eosinophils Relative: 1 %
HCT: 29.7 % — ABNORMAL LOW (ref 36.0–46.0)
Hemoglobin: 9.6 g/dL — ABNORMAL LOW (ref 12.0–15.0)
Immature Granulocytes: 2 %
Lymphocytes Relative: 37 %
Lymphs Abs: 1.4 10*3/uL (ref 0.7–4.0)
MCH: 29.9 pg (ref 26.0–34.0)
MCHC: 32.3 g/dL (ref 30.0–36.0)
MCV: 92.5 fL (ref 80.0–100.0)
Monocytes Absolute: 0.3 10*3/uL (ref 0.1–1.0)
Monocytes Relative: 9 %
Neutro Abs: 1.9 10*3/uL (ref 1.7–7.7)
Neutrophils Relative %: 50 %
Platelets: 280 10*3/uL (ref 150–400)
RBC: 3.21 MIL/uL — ABNORMAL LOW (ref 3.87–5.11)
RDW: 15 % (ref 11.5–15.5)
WBC: 3.7 10*3/uL — ABNORMAL LOW (ref 4.0–10.5)
nRBC: 0 % (ref 0.0–0.2)

## 2021-06-11 LAB — BASIC METABOLIC PANEL
Anion gap: 6 (ref 5–15)
BUN: 29 mg/dL — ABNORMAL HIGH (ref 8–23)
CO2: 26 mmol/L (ref 22–32)
Calcium: 9.4 mg/dL (ref 8.9–10.3)
Chloride: 107 mmol/L (ref 98–111)
Creatinine, Ser: 1.36 mg/dL — ABNORMAL HIGH (ref 0.44–1.00)
GFR, Estimated: 41 mL/min — ABNORMAL LOW (ref >60)
Glucose, Bld: 101 mg/dL — ABNORMAL HIGH (ref 70–99)
Potassium: 4.7 mmol/L (ref 3.5–5.1)
Sodium: 139 mmol/L (ref 135–145)

## 2021-06-11 LAB — APTT: aPTT: 28 seconds (ref 24–36)

## 2021-06-11 LAB — IRON AND TIBC
Iron: 63 ug/dL (ref 28–170)
Saturation Ratios: 25 % (ref 10.4–31.8)
TIBC: 251 ug/dL (ref 250–450)
UIBC: 188 ug/dL

## 2021-06-11 MED ORDER — EPOETIN ALFA-EPBX 10000 UNIT/ML IJ SOLN
15000.0000 [IU] | Freq: Once | INTRAMUSCULAR | Status: AC
  Administered 2021-06-11: 15000 [IU] via SUBCUTANEOUS
  Filled 2021-06-11: qty 2

## 2021-06-11 NOTE — Progress Notes (Signed)
Jennifer Li presents today for Retacrit injection per the provider's orders.  Stable during  administration without incident; injection site WNL; see MAR for injection details.  Patient tolerated procedure well and without incident.  No questions or complaints noted at this time. Discharge from clinic ambulatory in stable condition.  Alert and oriented X 3.  Follow up with [facility] as scheduled.

## 2021-06-11 NOTE — Patient Instructions (Signed)
[facility]  Discharge Instructions: Thank you for choosing [facility] to provide your oncology and hematology care.  If you have a lab appointment with the Cancer Center, please come in thru the Main Entrance and check in at the main information desk.  Wear comfortable clothing and clothing appropriate for easy access to any Portacath or PICC line.   We strive to give you quality time with your provider. You may need to reschedule your appointment if you arrive late (15 or more minutes).  Arriving late affects you and other patients whose appointments are after yours.  Also, if you miss three or more appointments without notifying the office, you may be dismissed from the clinic at the provider's discretion.      For prescription refill requests, have your pharmacy contact our office and allow 72 hours for refills to be completed.    Today you received the following chemotherapy and/or immunotherapy agents Retacrit      To help prevent nausea and vomiting after your treatment, we encourage you to take your nausea medication as directed.  BELOW ARE SYMPTOMS THAT SHOULD BE REPORTED IMMEDIATELY: *FEVER GREATER THAN 100.4 F (38 C) OR HIGHER *CHILLS OR SWEATING *NAUSEA AND VOMITING THAT IS NOT CONTROLLED WITH YOUR NAUSEA MEDICATION *UNUSUAL SHORTNESS OF BREATH *UNUSUAL BRUISING OR BLEEDING *URINARY PROBLEMS (pain or burning when urinating, or frequent urination) *BOWEL PROBLEMS (unusual diarrhea, constipation, pain near the anus) TENDERNESS IN MOUTH AND THROAT WITH OR WITHOUT PRESENCE OF ULCERS (sore throat, sores in mouth, or a toothache) UNUSUAL RASH, SWELLING OR PAIN  UNUSUAL VAGINAL DISCHARGE OR ITCHING   Items with * indicate a potential emergency and should be followed up as soon as possible or go to the Emergency Department if any problems should occur.  Please show the CHEMOTHERAPY ALERT CARD or IMMUNOTHERAPY ALERT CARD at check-in to the Emergency  Department and triage nurse.  Should you have questions after your visit or need to cancel or reschedule your appointment, please contact [facility] [phone]  and follow the prompts.  Office hours are 8:00 a.m. to 4:30 p.m. Monday - Friday. Please note that voicemails left after 4:00 p.m. may not be returned until the following business day.  We are closed weekends and major holidays. You have access to a nurse at all times for urgent questions. Please call the main number to the clinic [phone] and follow the prompts.  For any non-urgent questions, you may also contact your provider using MyChart. We now offer e-Visits for anyone 18 and older to request care online for non-urgent symptoms. For details visit [email or web address].   Also download the MyChart app! Go to the app store, search "MyChart", open the app, select [facility], and log in with your MyChart username and password.  Due to Covid, a mask is required upon entering the hospital/clinic. If you do not have a mask, one will be given to you upon arrival. For doctor visits, patients may have 1 support person aged 18 or older with them. For treatment visits, patients cannot have anyone with them due to current Covid guidelines and our immunocompromised population.  

## 2021-06-15 ENCOUNTER — Ambulatory Visit (HOSPITAL_COMMUNITY): Payer: Medicare HMO

## 2021-06-15 ENCOUNTER — Other Ambulatory Visit (HOSPITAL_COMMUNITY): Payer: Medicare HMO

## 2021-06-24 ENCOUNTER — Encounter (HOSPITAL_COMMUNITY): Payer: Self-pay

## 2021-06-24 ENCOUNTER — Other Ambulatory Visit (HOSPITAL_COMMUNITY): Payer: Self-pay | Admitting: *Deleted

## 2021-06-24 ENCOUNTER — Inpatient Hospital Stay (HOSPITAL_COMMUNITY): Payer: Medicare HMO

## 2021-06-24 ENCOUNTER — Other Ambulatory Visit: Payer: Self-pay

## 2021-06-24 ENCOUNTER — Ambulatory Visit (HOSPITAL_COMMUNITY): Payer: Medicare HMO

## 2021-06-24 ENCOUNTER — Other Ambulatory Visit (HOSPITAL_COMMUNITY): Payer: Medicare HMO

## 2021-06-24 VITALS — BP 151/64 | HR 59 | Temp 97.8°F | Resp 18

## 2021-06-24 DIAGNOSIS — D649 Anemia, unspecified: Secondary | ICD-10-CM

## 2021-06-24 DIAGNOSIS — N183 Chronic kidney disease, stage 3 unspecified: Secondary | ICD-10-CM | POA: Diagnosis not present

## 2021-06-24 DIAGNOSIS — N1831 Chronic kidney disease, stage 3a: Secondary | ICD-10-CM

## 2021-06-24 LAB — CBC
HCT: 28.5 % — ABNORMAL LOW (ref 36.0–46.0)
Hemoglobin: 8.8 g/dL — ABNORMAL LOW (ref 12.0–15.0)
MCH: 29 pg (ref 26.0–34.0)
MCHC: 30.9 g/dL (ref 30.0–36.0)
MCV: 94.1 fL (ref 80.0–100.0)
Platelets: 154 10*3/uL (ref 150–400)
RBC: 3.03 MIL/uL — ABNORMAL LOW (ref 3.87–5.11)
RDW: 16.2 % — ABNORMAL HIGH (ref 11.5–15.5)
WBC: 3.4 10*3/uL — ABNORMAL LOW (ref 4.0–10.5)
nRBC: 0 % (ref 0.0–0.2)

## 2021-06-24 MED ORDER — EPOETIN ALFA-EPBX 20000 UNIT/ML IJ SOLN
20000.0000 [IU] | Freq: Once | INTRAMUSCULAR | Status: AC
  Administered 2021-06-24: 15:00:00 20000 [IU] via SUBCUTANEOUS
  Filled 2021-06-24: qty 1

## 2021-06-24 MED ORDER — EPOETIN ALFA-EPBX 10000 UNIT/ML IJ SOLN: 15000.0000 [IU] | Freq: Once | INTRAMUSCULAR | Status: DC

## 2021-06-24 NOTE — Progress Notes (Signed)
Jennifer Li presents today for injection per the provider's orders.  Retacrit 20,000  administration without incident; injection site WNL; see MAR for injection details.  Patient tolerated procedure well and without incident.  No questions or complaints noted at this time.  Treatment given today per MD orders. Tolerated infusion without adverse affects. Vital signs stable. No complaints at this time. Discharged from clinic ambulatory in stable condition. Alert and oriented x 3. F/U with [facility] as scheduled.

## 2021-06-24 NOTE — Patient Instructions (Signed)
[facility]  Discharge Instructions: ?Thank you for choosing [facility] to provide your oncology and hematology care.  ?If you have a lab appointment with the Cancer Center, please come in thru the Main Entrance and check in at the main information desk. ? ?Wear comfortable clothing and clothing appropriate for easy access to any Portacath or PICC line.  ? ?We strive to give you quality time with your provider. You may need to reschedule your appointment if you arrive late (15 or more minutes).  Arriving late affects you and other patients whose appointments are after yours.  Also, if you miss three or more appointments without notifying the office, you may be dismissed from the clinic at the provider?s discretion.    ?  ?For prescription refill requests, have your pharmacy contact our office and allow 72 hours for refills to be completed.   ? ?Today you received the following : Retacrit 20,000 units    ?  ?To help prevent nausea and vomiting after your treatment, we encourage you to take your nausea medication as directed. ? ?BELOW ARE SYMPTOMS THAT SHOULD BE REPORTED IMMEDIATELY: ?*FEVER GREATER THAN 100.4 F (38 ?C) OR HIGHER ?*CHILLS OR SWEATING ?*NAUSEA AND VOMITING THAT IS NOT CONTROLLED WITH YOUR NAUSEA MEDICATION ?*UNUSUAL SHORTNESS OF BREATH ?*UNUSUAL BRUISING OR BLEEDING ?*URINARY PROBLEMS (pain or burning when urinating, or frequent urination) ?*BOWEL PROBLEMS (unusual diarrhea, constipation, pain near the anus) ?TENDERNESS IN MOUTH AND THROAT WITH OR WITHOUT PRESENCE OF ULCERS (sore throat, sores in mouth, or a toothache) ?UNUSUAL RASH, SWELLING OR PAIN  ?UNUSUAL VAGINAL DISCHARGE OR ITCHING  ? ?Items with * indicate a potential emergency and should be followed up as soon as possible or go to the Emergency Department if any problems should occur. ? ?Please show the CHEMOTHERAPY ALERT CARD or IMMUNOTHERAPY ALERT CARD at check-in to the Emergency Department and triage  nurse. ? ?Should you have questions after your visit or need to cancel or reschedule your appointment, please contact  CANC[facility] CENTER [phone]  and follow the prompts.  Office hours are 8:00 a.m. to 4:30 p.m. Monday - Friday. Please note that voicemails left after 4:00 p.m. may not be returned until the following business day.  We are closed weekends and major holidays. You have access to a nurse at all times for urgent questions. Please call the main number to the clinic [phone] and follow the prompts. ? ?For any non-urgent questions, you may also contact your provider using MyChart. We now offer e-Visits for anyone 18 and older to request care online for non-urgent symptoms. For details visit [email or web address]. ?  ?Also download the MyChart app! Go to the app store, search "MyChart", open the app, select [facility], and log in with your MyChart username and password. ? ?Due to Covid, a mask is required upon entering the hospital/clinic. If you do not have a mask, one will be given to you upon arrival. For doctor visits, patients may have 1 support person aged 18 or older with them. For treatment visits, patients cannot have anyone with them due to current Covid guidelines and our immunocompromised population.  ?

## 2021-06-24 NOTE — Progress Notes (Signed)
Increase Retacrit to 20,000 units Subq going forward.  With the following labs:  may increase 20,000 units q 2wk and check ferretin , iron panel next time in 2 wks  T.O. Dr Rhys Martini, PharmD

## 2021-07-08 ENCOUNTER — Other Ambulatory Visit (HOSPITAL_COMMUNITY): Payer: Medicare HMO

## 2021-07-08 ENCOUNTER — Other Ambulatory Visit: Payer: Self-pay

## 2021-07-08 ENCOUNTER — Inpatient Hospital Stay (HOSPITAL_COMMUNITY): Payer: Medicare HMO | Attending: Hematology

## 2021-07-08 ENCOUNTER — Encounter (HOSPITAL_COMMUNITY): Payer: Self-pay

## 2021-07-08 ENCOUNTER — Inpatient Hospital Stay (HOSPITAL_COMMUNITY): Payer: Medicare HMO

## 2021-07-08 ENCOUNTER — Ambulatory Visit (HOSPITAL_COMMUNITY): Payer: Medicare HMO

## 2021-07-08 VITALS — BP 155/77 | HR 60 | Temp 97.6°F | Resp 18

## 2021-07-08 DIAGNOSIS — D649 Anemia, unspecified: Secondary | ICD-10-CM

## 2021-07-08 DIAGNOSIS — D631 Anemia in chronic kidney disease: Secondary | ICD-10-CM | POA: Diagnosis present

## 2021-07-08 DIAGNOSIS — N183 Chronic kidney disease, stage 3 unspecified: Secondary | ICD-10-CM | POA: Insufficient documentation

## 2021-07-08 LAB — CBC
HCT: 31.5 % — ABNORMAL LOW (ref 36.0–46.0)
Hemoglobin: 10.2 g/dL — ABNORMAL LOW (ref 12.0–15.0)
MCH: 30.7 pg (ref 26.0–34.0)
MCHC: 32.4 g/dL (ref 30.0–36.0)
MCV: 94.9 fL (ref 80.0–100.0)
Platelets: 183 10*3/uL (ref 150–400)
RBC: 3.32 MIL/uL — ABNORMAL LOW (ref 3.87–5.11)
RDW: 15.9 % — ABNORMAL HIGH (ref 11.5–15.5)
WBC: 3.5 10*3/uL — ABNORMAL LOW (ref 4.0–10.5)
nRBC: 0 % (ref 0.0–0.2)

## 2021-07-08 LAB — IRON AND TIBC
Iron: 48 ug/dL (ref 28–170)
Saturation Ratios: 16 % (ref 10.4–31.8)
TIBC: 298 ug/dL (ref 250–450)
UIBC: 250 ug/dL

## 2021-07-08 LAB — FERRITIN: Ferritin: 538 ng/mL — ABNORMAL HIGH (ref 11–307)

## 2021-07-08 MED ORDER — EPOETIN ALFA-EPBX 10000 UNIT/ML IJ SOLN: 15000.0000 [IU] | Freq: Once | INTRAMUSCULAR | Status: DC

## 2021-07-08 MED ORDER — EPOETIN ALFA-EPBX 20000 UNIT/ML IJ SOLN
20000.0000 [IU] | Freq: Once | INTRAMUSCULAR | Status: AC
  Administered 2021-07-08: 20000 [IU] via SUBCUTANEOUS
  Filled 2021-07-08: qty 1

## 2021-07-08 NOTE — Patient Instructions (Signed)
[facility]  Discharge Instructions: Thank you for choosing [facility] to provide your oncology and hematology care.  If you have a lab appointment with the Cancer Center, please come in thru the Main Entrance and check in at the main information desk.  Wear comfortable clothing and clothing appropriate for easy access to any Portacath or PICC line.   We strive to give you quality time with your provider. You may need to reschedule your appointment if you arrive late (15 or more minutes).  Arriving late affects you and other patients whose appointments are after yours.  Also, if you miss three or more appointments without notifying the office, you may be dismissed from the clinic at the provider's discretion.      For prescription refill requests, have your pharmacy contact our office and allow 72 hours for refills to be completed.    Today you received Retacrit injection      To help prevent nausea and vomiting after your treatment, we encourage you to take your nausea medication as directed.  BELOW ARE SYMPTOMS THAT SHOULD BE REPORTED IMMEDIATELY: *FEVER GREATER THAN 100.4 F (38 C) OR HIGHER *CHILLS OR SWEATING *NAUSEA AND VOMITING THAT IS NOT CONTROLLED WITH YOUR NAUSEA MEDICATION *UNUSUAL SHORTNESS OF BREATH *UNUSUAL BRUISING OR BLEEDING *URINARY PROBLEMS (pain or burning when urinating, or frequent urination) *BOWEL PROBLEMS (unusual diarrhea, constipation, pain near the anus) TENDERNESS IN MOUTH AND THROAT WITH OR WITHOUT PRESENCE OF ULCERS (sore throat, sores in mouth, or a toothache) UNUSUAL RASH, SWELLING OR PAIN  UNUSUAL VAGINAL DISCHARGE OR ITCHING   Items with * indicate a potential emergency and should be followed up as soon as possible or go to the Emergency Department if any problems should occur.  Please show the CHEMOTHERAPY ALERT CARD or IMMUNOTHERAPY ALERT CARD at check-in to the Emergency Department and triage nurse.  Should you have  questions after your visit or need to cancel or reschedule your appointment, please contact [facility] [phone]  and follow the prompts.  Office hours are 8:00 a.m. to 4:30 p.m. Monday - Friday. Please note that voicemails left after 4:00 p.m. may not be returned until the following business day.  We are closed weekends and major holidays. You have access to a nurse at all times for urgent questions. Please call the main number to the clinic [phone] and follow the prompts.  For any non-urgent questions, you may also contact your provider using MyChart. We now offer e-Visits for anyone 18 and older to request care online for non-urgent symptoms. For details visit [email or web address].   Also download the MyChart app! Go to the app store, search "MyChart", open the app, select [facility], and log in with your MyChart username and password.  Due to Covid, a mask is required upon entering the hospital/clinic. If you do not have a mask, one will be given to you upon arrival. For doctor visits, patients may have 1 support person aged 18 or older with them. For treatment visits, patients cannot have anyone with them due to current Covid guidelines and our immunocompromised population.  

## 2021-07-08 NOTE — Progress Notes (Signed)
Reviewed at tx visit on 07/08/21 by Dr. Delton Coombes

## 2021-07-08 NOTE — Progress Notes (Signed)
Patient presents today for Retacrit injection. Hemoglobin reviewed prior to administration. VSS tolerated without incident or complaint. See MAR for details. Patient stable during and after injection. Patient discharged in satisfactory condition with no s/s of distress noted.  

## 2021-07-21 ENCOUNTER — Other Ambulatory Visit (HOSPITAL_COMMUNITY): Payer: Self-pay

## 2021-07-21 DIAGNOSIS — D649 Anemia, unspecified: Secondary | ICD-10-CM

## 2021-07-21 NOTE — Progress Notes (Signed)
Jennifer Li, [place] [id number]   CLINIC:  Medical Oncology/Hematology  PCP:  System, Provider Not In No address on file None   REASON FOR VISIT:  Follow-up for iron deficiency anemia and anemia of CKD  PRIOR THERAPY: None  CURRENT THERAPY: Intermittent IV iron infusions, Retacrit 20,000 units every 2 weeks  INTERVAL HISTORY:  Jennifer Li 71 y.o. female returns for routine follow-up of Jennifer Li anemia of CKD and iron deficiency.  Jennifer Li was last seen by Dr. Delton Coombes on 05/18/2021.  At today's visit, Jennifer Li reports feeling fairly well.  No recent hospitalizations, surgeries, or changes in baseline health status.  Jennifer Li was started on Retacrit injections at Jennifer Li last visit, which Jennifer Li is tolerating well.  Jennifer Li has not noticed any signs of DVT or PE such as unilateral leg swelling, chest pain, or new shortness of breath.  Jennifer Li does continue to have ongoing issues with Jennifer Li blood pressure, and reports that it has been elevated at home with SBP higher than 200, which Jennifer Li attributes to stress.  Blood pressure today is 129/86.  Jennifer Li has not noticed any bleeding such as hematemesis, hematochezia, melena, or epistaxis.  Jennifer Li reports that Jennifer Li energy is somewhat better with Jennifer Li improved hemoglobin.  Jennifer Li denies any pica, restless legs, chest pain, dyspnea on exertion, lightheadedness, or syncope.  Jennifer Li does have occasional headaches.  Jennifer Li has 75% energy and 100% appetite. Jennifer Li endorses that Jennifer Li is maintaining a stable weight.   REVIEW OF SYSTEMS:  Review of Systems  Constitutional:  Positive for fatigue. Negative for appetite change, chills, diaphoresis, fever and unexpected weight change.  HENT:   Negative for lump/mass and nosebleeds.   Eyes:  Negative for eye problems.  Respiratory:  Negative for cough, hemoptysis and shortness of breath.   Cardiovascular:  Negative for chest pain, leg swelling and palpitations.  Gastrointestinal:  Negative for abdominal pain, blood in  stool, constipation, diarrhea, nausea and vomiting.  Genitourinary:  Negative for hematuria.   Skin: Negative.   Neurological:  Positive for headaches. Negative for dizziness and light-headedness.  Hematological:  Does not bruise/bleed easily.  Psychiatric/Behavioral:  Positive for depression. The patient is nervous/anxious.      PAST MEDICAL/SURGICAL HISTORY:  Past Medical History:  Diagnosis Date   Arthritis    Cataract    Chronic kidney disease    stage 3   Heart murmur    Past Surgical History:  Procedure Laterality Date   ABDOMINAL HYSTERECTOMY     CATARACT EXTRACTION, BILATERAL     gaglion cyst removal     MYOMECTOMY       SOCIAL HISTORY:  Social History   Socioeconomic History   Marital status: Widowed    Spouse name: Not on file   Number of children: Not on file   Years of education: Not on file   Highest education level: Not on file  Occupational History   Not on file  Tobacco Use   Smoking status: Former   Smokeless tobacco: Never  Substance and Sexual Activity   Alcohol use: Never   Drug use: Never   Sexual activity: Not Currently  Other Topics Concern   Not on file  Social History Narrative   Not on file   Social Determinants of Health   Financial Resource Strain: Not on file  Food Insecurity: Not on file  Transportation Needs: Not on file  Physical Activity: Not on file  Stress: Not on file  Social Connections: Not on file  Intimate Partner Violence: Not on file    FAMILY HISTORY:  Family History  Problem Relation Age of Onset   Diabetes Sister    Diabetes Son     CURRENT MEDICATIONS:  Outpatient Encounter Medications as of 07/22/2021  Medication Sig   ascorbic acid (VITAMIN C) 1000 MG tablet Take by mouth.   aspirin EC 81 MG tablet Take 81 mg by mouth daily. Swallow whole.   cholecalciferol (VITAMIN D3) 25 MCG (1000 UNIT) tablet Take 1,000 Units by mouth daily.   Cholecalciferol 25 MCG (1000 UT) capsule 1 capsule 1 (one) time each  day   clonazePAM (KLONOPIN) 0.5 MG tablet clonazepam 0.5 mg tablet   cloNIDine (CATAPRES) 0.1 MG tablet clonidine HCl 0.1 mg tablet   cloNIDine (CATAPRES) 0.1 MG tablet Take by mouth.   famciclovir (FAMVIR) 500 MG tablet SMARTSIG:3 Tablet(s) By Mouth   Ferrous Gluconate 239 (27 Fe) MG TABS Take by mouth.   losartan (COZAAR) 100 MG tablet losartan 100 mg tablet   metoprolol tartrate (LOPRESSOR) 100 MG tablet metoprolol tartrate 100 mg tablet   nitroGLYCERIN (NITROSTAT) 0.4 MG SL tablet nitroglycerin 0.4 mg sublingual tablet   pantoprazole (PROTONIX) 40 MG tablet TAKE 1 TABLET EVERY DAY BY ORAL ROUTE AS NEEDED.   spironolactone (ALDACTONE) 25 MG tablet spironolactone 25 mg tablet   temazepam (RESTORIL) 15 MG capsule temazepam 15 mg capsule  PRN   triamcinolone ointment (KENALOG) 0.1 % Apply topically 2 (two) times daily.   No facility-administered encounter medications on file as of 07/22/2021.    ALLERGIES:  Allergies  Allergen Reactions   Escitalopram    Gemfibrozil Hives and Other (See Comments)   Sulfa Antibiotics Hives   Sulfur    Trazodone      PHYSICAL EXAM:  ECOG PERFORMANCE STATUS: 1 - Symptomatic but completely ambulatory  There were no vitals filed for this visit. There were no vitals filed for this visit. Physical Exam Constitutional:      Appearance: Normal appearance.  HENT:     Head: Normocephalic and atraumatic.     Mouth/Throat:     Mouth: Mucous membranes are moist.  Eyes:     Extraocular Movements: Extraocular movements intact.     Pupils: Pupils are equal, round, and reactive to light.  Cardiovascular:     Rate and Rhythm: Normal rate and regular rhythm.     Pulses: Normal pulses.     Heart sounds: Normal heart sounds.  Pulmonary:     Effort: Pulmonary effort is normal.     Breath sounds: Normal breath sounds.  Abdominal:     General: Bowel sounds are normal.     Palpations: Abdomen is soft.     Tenderness: There is no abdominal tenderness.   Musculoskeletal:        General: No swelling.     Right lower leg: No edema.     Left lower leg: No edema.  Lymphadenopathy:     Cervical: No cervical adenopathy.  Skin:    General: Skin is warm and dry.  Neurological:     General: No focal deficit present.     Mental Status: Jennifer Li is alert and oriented to person, place, and time.  Psychiatric:        Mood and Affect: Mood normal.        Behavior: Behavior normal.     LABORATORY DATA:  I have reviewed the labs as listed.  CBC    Component Value Date/Time   WBC 3.5 (L) 07/08/2021 1109  RBC 3.32 (L) 07/08/2021 1109   HGB 10.2 (L) 07/08/2021 1109   HCT 31.5 (L) 07/08/2021 1109   HCT 29.8 (L) 06/20/2020 1315   PLT 183 07/08/2021 1109   MCV 94.9 07/08/2021 1109   MCH 30.7 07/08/2021 1109   MCHC 32.4 07/08/2021 1109   RDW 15.9 (H) 07/08/2021 1109   LYMPHSABS 1.4 06/11/2021 1444   MONOABS 0.3 06/11/2021 1444   EOSABS 0.1 06/11/2021 1444   BASOSABS 0.0 06/11/2021 1444   CMP Latest Ref Rng & Units 06/11/2021 05/18/2021 02/16/2021  Glucose 70 - 99 mg/dL 101(H) 96 103(H)  BUN 8 - 23 mg/dL 29(H) 28(H) 32(H)  Creatinine 0.44 - 1.00 mg/dL 1.36(H) 1.16(H) 1.46(H)  Sodium 135 - 145 mmol/L 139 140 140  Potassium 3.5 - 5.1 mmol/L 4.7 4.0 3.8  Chloride 98 - 111 mmol/L 107 109 108  CO2 22 - 32 mmol/L _0 Calcium 8.9 - 10.3 mg/dL 9.4 9.6 9.1  Total Protein 6.5 - 8.1 g/dL - 6.4(L) 7.0  Total Bilirubin 0.3 - 1.2 mg/dL - 0.8 1.2  Alkaline Phos 38 - 126 U/L - 30(L) 37(L)  AST 15 - 41 U/L - 17 19  ALT 0 - 44 U/L - 13 13    DIAGNOSTIC IMAGING:  I have independently reviewed the relevant imaging and discussed with the patient.  ASSESSMENT & PLAN: 1.  Anemia of CKD and iron deficiency - EGD and colonoscopy in March 2021 by Dr. Phillip Heal showed mild gastritis, diverticulosis, internal and external hemorrhoids, 1 polyp which was noncancerous. - Capsule endoscopy in September 2021 - Bone marrow biopsy on 08/07/2020, normocellular marrow  with trilineage hematopoiesis.  Mild polytypic plasmacytosis.  Chromosome analysis was normal.  SPEP was negative.  Free light chain ratio was normal with elevated kappa light chains consistent with CKD. - No prior history of blood transfusions.  No family history of sickle cell or thalassemia. - Jennifer Li received Venofer 3 doses amounting to 1 g from 02/23/2021 through 03/09/2021, with some improvement in energy levels - Jennifer Li stopped iron tablet due to lack of improvement. - Jennifer Li denies any bleeding per rectum or melena.   - Jennifer Li was started on Retacrit in December 2022, current dose is 20,000 units every 2 weeks - Most recent labs (07/22/2021): Hgb 10.6/MCV 94.2, ferritin 414, iron saturation 17%. - PLAN: Continue Retacrit 20,000 units every 2 weeks with biweekly CBC. - Labs and RTC in 3 months.  2.  Hypertension - Blood pressure is managed by Jennifer Li nephrologist (Dr. Elita Quick) - Jennifer Li is taking losartan 100 mg, Catapres 0.1 mg, Aldactone 25 mg, and Lopressor 100 mg  - Blood pressure today was 129/86, but Jennifer Li reports that SBP at home is sometimes greater than 200 in situations of stress - PLAN: Encouraged ongoing follow-up with Jennifer Li other specialists for management of Jennifer Li hypertension.  3.  Stage III CKD: - Jennifer Li follows with Dr. Elita Quick in [place], [place]: CBC + Retacrit every 2 weeks Labs and RTC 3 months  All questions were answered. The patient knows to call the clinic with any problems, questions or concerns.  Medical decision making: Moderate  Time spent on visit: I spent 20 minutes counseling the patient face to face. The total time spent in the appointment was 30 minutes and more than 50% was on counseling.   Harriett Rush, PA-C  07/22/2021 1:58 PM

## 2021-07-22 ENCOUNTER — Inpatient Hospital Stay (HOSPITAL_COMMUNITY): Payer: Medicare HMO | Admitting: Physician Assistant

## 2021-07-22 ENCOUNTER — Inpatient Hospital Stay (HOSPITAL_COMMUNITY): Payer: Medicare HMO

## 2021-07-22 ENCOUNTER — Ambulatory Visit (HOSPITAL_COMMUNITY): Payer: Medicare HMO

## 2021-07-22 ENCOUNTER — Other Ambulatory Visit (HOSPITAL_COMMUNITY): Payer: Medicare HMO

## 2021-07-22 ENCOUNTER — Other Ambulatory Visit: Payer: Self-pay

## 2021-07-22 ENCOUNTER — Ambulatory Visit (HOSPITAL_COMMUNITY): Payer: Medicare HMO | Admitting: Hematology

## 2021-07-22 VITALS — BP 129/86 | HR 63 | Temp 96.7°F | Resp 18 | Ht 61.42 in | Wt 118.6 lb

## 2021-07-22 DIAGNOSIS — N1831 Chronic kidney disease, stage 3a: Secondary | ICD-10-CM

## 2021-07-22 DIAGNOSIS — D649 Anemia, unspecified: Secondary | ICD-10-CM

## 2021-07-22 DIAGNOSIS — N183 Chronic kidney disease, stage 3 unspecified: Secondary | ICD-10-CM | POA: Diagnosis not present

## 2021-07-22 LAB — CBC WITH DIFFERENTIAL/PLATELET
Abs Immature Granulocytes: 0.01 10*3/uL (ref 0.00–0.07)
Basophils Absolute: 0 10*3/uL (ref 0.0–0.1)
Basophils Relative: 1 %
Eosinophils Absolute: 0.1 10*3/uL (ref 0.0–0.5)
Eosinophils Relative: 2 %
HCT: 32.6 % — ABNORMAL LOW (ref 36.0–46.0)
Hemoglobin: 10.6 g/dL — ABNORMAL LOW (ref 12.0–15.0)
Immature Granulocytes: 0 %
Lymphocytes Relative: 26 %
Lymphs Abs: 1.1 10*3/uL (ref 0.7–4.0)
MCH: 30.6 pg (ref 26.0–34.0)
MCHC: 32.5 g/dL (ref 30.0–36.0)
MCV: 94.2 fL (ref 80.0–100.0)
Monocytes Absolute: 0.2 10*3/uL (ref 0.1–1.0)
Monocytes Relative: 6 %
Neutro Abs: 2.6 10*3/uL (ref 1.7–7.7)
Neutrophils Relative %: 65 %
Platelets: 151 10*3/uL (ref 150–400)
RBC: 3.46 MIL/uL — ABNORMAL LOW (ref 3.87–5.11)
RDW: 15.9 % — ABNORMAL HIGH (ref 11.5–15.5)
WBC: 4 10*3/uL (ref 4.0–10.5)
nRBC: 0 % (ref 0.0–0.2)

## 2021-07-22 LAB — IRON AND TIBC
Iron: 46 ug/dL (ref 28–170)
Saturation Ratios: 17 % (ref 10.4–31.8)
TIBC: 266 ug/dL (ref 250–450)
UIBC: 220 ug/dL

## 2021-07-22 LAB — FERRITIN: Ferritin: 414 ng/mL — ABNORMAL HIGH (ref 11–307)

## 2021-07-22 MED ORDER — EPOETIN ALFA-EPBX 20000 UNIT/ML IJ SOLN
20000.0000 [IU] | Freq: Once | INTRAMUSCULAR | Status: AC
  Administered 2021-07-22: 20000 [IU] via SUBCUTANEOUS
  Filled 2021-07-22: qty 1

## 2021-07-22 NOTE — Patient Instructions (Signed)
[facility] Discharge Instructions  You were seen today by Rojelio Brenner PA-C for your anemia.  Your blood counts have improved.  We will continue giving you Retacrit injections 20,000 units every 2 weeks.  We will continue to have your blood checked on the same day as your Retacrit injections.  FOLLOW-UP APPOINTMENT: Office visit in 3 months with same-day labs   Thank you for choosing [facility] at [facility] to provide your oncology and hematology care.  To afford each patient quality time with our provider, please arrive at least 15 minutes before your scheduled appointment time.   If you have a lab appointment with the Cancer Center please come in thru the Main Entrance and check in at the main information desk.  You need to re-schedule your appointment should you arrive 10 or more minutes late.  We strive to give you quality time with our providers, and arriving late affects you and other patients whose appointments are after yours.  Also, if you no show three or more times for appointments you may be dismissed from the clinic at the providers discretion.     Again, thank you for choosing [facility].  Our hope is that these requests will decrease the amount of time that you wait before being seen by our physicians.       _____________________________________________________________  Should you have questions after your visit to [facility], please contact our office at [phone] and follow the prompts.  Our office hours are 8:00 a.m. and 4:30 p.m. Monday - Friday.  Please note that voicemails left after 4:00 p.m. may not be returned until the following business day.  We are closed weekends and major holidays.  You do have access to a nurse 24-7, just call the main number to the clinic [phone] and do not press any options, hold on the line and a nurse will answer the phone.    For prescription  refill requests, have your pharmacy contact our office and allow 72 hours.    Due to Covid, you will need to wear a mask upon entering the hospital. If you do not have a mask, a mask will be given to you at the Main Entrance upon arrival. For doctor visits, patients may have 1 support person age 21 or older with them. For treatment visits, patients can not have anyone with them due to social distancing guidelines and our immunocompromised population.

## 2021-07-22 NOTE — Progress Notes (Signed)
Patient presents today for Retacrit injection. Hemoglobin reviewed prior to administration. VSS tolerated without incident or complaint. See MAR for details. Patient stable during and after injection. Patient discharged in satisfactory condition with no s/s of distress noted.  

## 2021-07-22 NOTE — Patient Instructions (Signed)
[facility]  Discharge Instructions: Thank you for choosing [facility] to provide your oncology and hematology care.  If you have a lab appointment with the Cancer Center, please come in thru the Main Entrance and check in at the main information desk.  Wear comfortable clothing and clothing appropriate for easy access to any Portacath or PICC line.   We strive to give you quality time with your provider. You may need to reschedule your appointment if you arrive late (15 or more minutes).  Arriving late affects you and other patients whose appointments are after yours.  Also, if you miss three or more appointments without notifying the office, you may be dismissed from the clinic at the provider's discretion.      For prescription refill requests, have your pharmacy contact our office and allow 72 hours for refills to be completed.    Today you received the following Retacrit, return as scheduled.    To help prevent nausea and vomiting after your treatment, we encourage you to take your nausea medication as directed.  BELOW ARE SYMPTOMS THAT SHOULD BE REPORTED IMMEDIATELY: *FEVER GREATER THAN 100.4 F (38 C) OR HIGHER *CHILLS OR SWEATING *NAUSEA AND VOMITING THAT IS NOT CONTROLLED WITH YOUR NAUSEA MEDICATION *UNUSUAL SHORTNESS OF BREATH *UNUSUAL BRUISING OR BLEEDING *URINARY PROBLEMS (pain or burning when urinating, or frequent urination) *BOWEL PROBLEMS (unusual diarrhea, constipation, pain near the anus) TENDERNESS IN MOUTH AND THROAT WITH OR WITHOUT PRESENCE OF ULCERS (sore throat, sores in mouth, or a toothache) UNUSUAL RASH, SWELLING OR PAIN  UNUSUAL VAGINAL DISCHARGE OR ITCHING   Items with * indicate a potential emergency and should be followed up as soon as possible or go to the Emergency Department if any problems should occur.  Please show the CHEMOTHERAPY ALERT CARD or IMMUNOTHERAPY ALERT CARD at check-in to the Emergency Department and triage  nurse.  Should you have questions after your visit or need to cancel or reschedule your appointment, please contact [facility] [phone]  and follow the prompts.  Office hours are 8:00 a.m. to 4:30 p.m. Monday - Friday. Please note that voicemails left after 4:00 p.m. may not be returned until the following business day.  We are closed weekends and major holidays. You have access to a nurse at all times for urgent questions. Please call the main number to the clinic [phone] and follow the prompts.  For any non-urgent questions, you may also contact your provider using MyChart. We now offer e-Visits for anyone 18 and older to request care online for non-urgent symptoms. For details visit [email or web address].   Also download the MyChart app! Go to the app store, search "MyChart", open the app, select [facility], and log in with your MyChart username and password.  Due to Covid, a mask is required upon entering the hospital/clinic. If you do not have a mask, one will be given to you upon arrival. For doctor visits, patients may have 1 support person aged 18 or older with them. For treatment visits, patients cannot have anyone with them due to current Covid guidelines and our immunocompromised population.  

## 2021-08-04 ENCOUNTER — Telehealth (HOSPITAL_COMMUNITY): Payer: Self-pay | Admitting: *Deleted

## 2021-08-04 NOTE — Telephone Encounter (Signed)
Received telephone call requesting information related to her diagnosis.  Spoke to [employer] - PAC to clarify and made her aware that her diagnosis of anemia is a result of her chronic kidney disease.  She has requested results of BMBX from 2022, which I advised she would need to request from medical records.  Verbalized understanding.   ?

## 2021-08-05 ENCOUNTER — Other Ambulatory Visit: Payer: Self-pay

## 2021-08-05 ENCOUNTER — Inpatient Hospital Stay (HOSPITAL_COMMUNITY): Payer: Medicare HMO | Attending: Hematology

## 2021-08-05 ENCOUNTER — Inpatient Hospital Stay (HOSPITAL_COMMUNITY): Payer: Medicare HMO

## 2021-08-05 VITALS — BP 123/75 | HR 58 | Temp 96.7°F | Resp 18 | Wt 118.0 lb

## 2021-08-05 DIAGNOSIS — N183 Chronic kidney disease, stage 3 unspecified: Secondary | ICD-10-CM | POA: Insufficient documentation

## 2021-08-05 DIAGNOSIS — N1831 Chronic kidney disease, stage 3a: Secondary | ICD-10-CM

## 2021-08-05 DIAGNOSIS — D631 Anemia in chronic kidney disease: Secondary | ICD-10-CM | POA: Insufficient documentation

## 2021-08-05 DIAGNOSIS — D649 Anemia, unspecified: Secondary | ICD-10-CM

## 2021-08-05 LAB — CBC
HCT: 33.8 % — ABNORMAL LOW (ref 36.0–46.0)
Hemoglobin: 10.9 g/dL — ABNORMAL LOW (ref 12.0–15.0)
MCH: 30 pg (ref 26.0–34.0)
MCHC: 32.2 g/dL (ref 30.0–36.0)
MCV: 93.1 fL (ref 80.0–100.0)
Platelets: 151 10*3/uL (ref 150–400)
RBC: 3.63 MIL/uL — ABNORMAL LOW (ref 3.87–5.11)
RDW: 15.9 % — ABNORMAL HIGH (ref 11.5–15.5)
WBC: 3.2 10*3/uL — ABNORMAL LOW (ref 4.0–10.5)
nRBC: 0 % (ref 0.0–0.2)

## 2021-08-05 MED ORDER — EPOETIN ALFA-EPBX 20000 UNIT/ML IJ SOLN
20000.0000 [IU] | Freq: Once | INTRAMUSCULAR | Status: AC
  Administered 2021-08-05: 20000 [IU] via SUBCUTANEOUS
  Filled 2021-08-05: qty 1

## 2021-08-05 NOTE — Patient Instructions (Signed)
[facility]  Discharge Instructions: Thank you for choosing [facility] to provide your oncology and hematology care.  If you have a lab appointment with the Cancer Center, please come in thru the Main Entrance and check in at the main information desk.  Wear comfortable clothing and clothing appropriate for easy access to any Portacath or PICC line.   We strive to give you quality time with your provider. You may need to reschedule your appointment if you arrive late (15 or more minutes).  Arriving late affects you and other patients whose appointments are after yours.  Also, if you miss three or more appointments without notifying the office, you may be dismissed from the clinic at the provider's discretion.      For prescription refill requests, have your pharmacy contact our office and allow 72 hours for refills to be completed.        To help prevent nausea and vomiting after your treatment, we encourage you to take your nausea medication as directed.  BELOW ARE SYMPTOMS THAT SHOULD BE REPORTED IMMEDIATELY: *FEVER GREATER THAN 100.4 F (38 C) OR HIGHER *CHILLS OR SWEATING *NAUSEA AND VOMITING THAT IS NOT CONTROLLED WITH YOUR NAUSEA MEDICATION *UNUSUAL SHORTNESS OF BREATH *UNUSUAL BRUISING OR BLEEDING *URINARY PROBLEMS (pain or burning when urinating, or frequent urination) *BOWEL PROBLEMS (unusual diarrhea, constipation, pain near the anus) TENDERNESS IN MOUTH AND THROAT WITH OR WITHOUT PRESENCE OF ULCERS (sore throat, sores in mouth, or a toothache) UNUSUAL RASH, SWELLING OR PAIN  UNUSUAL VAGINAL DISCHARGE OR ITCHING   Items with * indicate a potential emergency and should be followed up as soon as possible or go to the Emergency Department if any problems should occur.  Please show the CHEMOTHERAPY ALERT CARD or IMMUNOTHERAPY ALERT CARD at check-in to the Emergency Department and triage nurse.  Should you have questions after your visit or need to cancel  or reschedule your appointment, please contact [facility] [phone]  and follow the prompts.  Office hours are 8:00 a.m. to 4:30 p.m. Monday - Friday. Please note that voicemails left after 4:00 p.m. may not be returned until the following business day.  We are closed weekends and major holidays. You have access to a nurse at all times for urgent questions. Please call the main number to the clinic [phone] and follow the prompts.  For any non-urgent questions, you may also contact your provider using MyChart. We now offer e-Visits for anyone 18 and older to request care online for non-urgent symptoms. For details visit [email or web address].   Also download the MyChart app! Go to the app store, search "MyChart", open the app, select [facility], and log in with your MyChart username and password.  Due to Covid, a mask is required upon entering the hospital/clinic. If you do not have a mask, one will be given to you upon arrival. For doctor visits, patients may have 1 support person aged 18 or older with them. For treatment visits, patients cannot have anyone with them due to current Covid guidelines and our immunocompromised population.  

## 2021-08-05 NOTE — Progress Notes (Signed)
Patient presents today for Retacrit injection.  Patient is in satisfactory condition with no new complaints voiced.  Vital signs are stable.  Hemoglobin today is 10.9.  We will proceed with injection per MD orders.  ?

## 2021-08-19 ENCOUNTER — Inpatient Hospital Stay (HOSPITAL_COMMUNITY): Payer: Medicare HMO

## 2021-08-19 ENCOUNTER — Other Ambulatory Visit: Payer: Self-pay

## 2021-08-19 DIAGNOSIS — N183 Chronic kidney disease, stage 3 unspecified: Secondary | ICD-10-CM | POA: Diagnosis not present

## 2021-08-19 DIAGNOSIS — D649 Anemia, unspecified: Secondary | ICD-10-CM

## 2021-08-19 DIAGNOSIS — N1831 Chronic kidney disease, stage 3a: Secondary | ICD-10-CM

## 2021-08-19 LAB — CBC
HCT: 36.7 % (ref 36.0–46.0)
Hemoglobin: 11.7 g/dL — ABNORMAL LOW (ref 12.0–15.0)
MCH: 29.3 pg (ref 26.0–34.0)
MCHC: 31.9 g/dL (ref 30.0–36.0)
MCV: 92 fL (ref 80.0–100.0)
Platelets: 158 10*3/uL (ref 150–400)
RBC: 3.99 MIL/uL (ref 3.87–5.11)
RDW: 15.9 % — ABNORMAL HIGH (ref 11.5–15.5)
WBC: 2.9 10*3/uL — ABNORMAL LOW (ref 4.0–10.5)
nRBC: 0 % (ref 0.0–0.2)

## 2021-08-19 NOTE — Progress Notes (Signed)
Hemoglobin today is 11.7, no retacrit injection need. Patient made aware.  ?

## 2021-09-02 ENCOUNTER — Inpatient Hospital Stay (HOSPITAL_COMMUNITY): Payer: Medicare HMO | Attending: Hematology

## 2021-09-02 ENCOUNTER — Inpatient Hospital Stay (HOSPITAL_COMMUNITY): Payer: Medicare HMO

## 2021-09-02 DIAGNOSIS — E611 Iron deficiency: Secondary | ICD-10-CM | POA: Insufficient documentation

## 2021-09-02 DIAGNOSIS — D649 Anemia, unspecified: Secondary | ICD-10-CM

## 2021-09-02 DIAGNOSIS — D631 Anemia in chronic kidney disease: Secondary | ICD-10-CM | POA: Diagnosis not present

## 2021-09-02 DIAGNOSIS — I129 Hypertensive chronic kidney disease with stage 1 through stage 4 chronic kidney disease, or unspecified chronic kidney disease: Secondary | ICD-10-CM | POA: Insufficient documentation

## 2021-09-02 DIAGNOSIS — N1831 Chronic kidney disease, stage 3a: Secondary | ICD-10-CM

## 2021-09-02 DIAGNOSIS — N183 Chronic kidney disease, stage 3 unspecified: Secondary | ICD-10-CM | POA: Insufficient documentation

## 2021-09-02 LAB — CBC
HCT: 34.8 % — ABNORMAL LOW (ref 36.0–46.0)
Hemoglobin: 11.3 g/dL — ABNORMAL LOW (ref 12.0–15.0)
MCH: 29.4 pg (ref 26.0–34.0)
MCHC: 32.5 g/dL (ref 30.0–36.0)
MCV: 90.6 fL (ref 80.0–100.0)
Platelets: 108 10*3/uL — ABNORMAL LOW (ref 150–400)
RBC: 3.84 MIL/uL — ABNORMAL LOW (ref 3.87–5.11)
RDW: 15.7 % — ABNORMAL HIGH (ref 11.5–15.5)
WBC: 4.1 10*3/uL (ref 4.0–10.5)
nRBC: 0 % (ref 0.0–0.2)

## 2021-09-02 NOTE — Patient Instructions (Signed)
[facility]  Discharge Instructions: Thank you for choosing [facility] to provide your oncology and hematology care.  If you have a lab appointment with the Cancer Center, please come in thru the Main Entrance and check in at the main information desk.  Wear comfortable clothing and clothing appropriate for easy access to any Portacath or PICC line.   We strive to give you quality time with your provider. You may need to reschedule your appointment if you arrive late (15 or more minutes).  Arriving late affects you and other patients whose appointments are after yours.  Also, if you miss three or more appointments without notifying the office, you may be dismissed from the clinic at the provider's discretion.      For prescription refill requests, have your pharmacy contact our office and allow 72 hours for refills to be completed.        To help prevent nausea and vomiting after your treatment, we encourage you to take your nausea medication as directed.  BELOW ARE SYMPTOMS THAT SHOULD BE REPORTED IMMEDIATELY: *FEVER GREATER THAN 100.4 F (38 C) OR HIGHER *CHILLS OR SWEATING *NAUSEA AND VOMITING THAT IS NOT CONTROLLED WITH YOUR NAUSEA MEDICATION *UNUSUAL SHORTNESS OF BREATH *UNUSUAL BRUISING OR BLEEDING *URINARY PROBLEMS (pain or burning when urinating, or frequent urination) *BOWEL PROBLEMS (unusual diarrhea, constipation, pain near the anus) TENDERNESS IN MOUTH AND THROAT WITH OR WITHOUT PRESENCE OF ULCERS (sore throat, sores in mouth, or a toothache) UNUSUAL RASH, SWELLING OR PAIN  UNUSUAL VAGINAL DISCHARGE OR ITCHING   Items with * indicate a potential emergency and should be followed up as soon as possible or go to the Emergency Department if any problems should occur.  Please show the CHEMOTHERAPY ALERT CARD or IMMUNOTHERAPY ALERT CARD at check-in to the Emergency Department and triage nurse.  Should you have questions after your visit or need to cancel  or reschedule your appointment, please contact [facility] [phone]  and follow the prompts.  Office hours are 8:00 a.m. to 4:30 p.m. Monday - Friday. Please note that voicemails left after 4:00 p.m. may not be returned until the following business day.  We are closed weekends and major holidays. You have access to a nurse at all times for urgent questions. Please call the main number to the clinic [phone] and follow the prompts.  For any non-urgent questions, you may also contact your provider using MyChart. We now offer e-Visits for anyone 18 and older to request care online for non-urgent symptoms. For details visit [email or web address].   Also download the MyChart app! Go to the app store, search "MyChart", open the app, select [facility], and log in with your MyChart username and password.  Due to Covid, a mask is required upon entering the hospital/clinic. If you do not have a mask, one will be given to you upon arrival. For doctor visits, patients may have 1 support person aged 18 or older with them. For treatment visits, patients cannot have anyone with them due to current Covid guidelines and our immunocompromised population.  

## 2021-09-02 NOTE — Progress Notes (Signed)
Hgb 11.3 today.  No injection needed.  Copy given to the patient.  No s/s of distress noted and no complaints voiced. Patient left in satisfactory condition.  ?

## 2021-09-07 ENCOUNTER — Encounter (HOSPITAL_COMMUNITY): Payer: Self-pay

## 2021-09-07 NOTE — Progress Notes (Signed)
Patient called to discuss venipuncture site (Right AC). Patient states that due to the way she was sitting at her last lab appointment, she felt a "stabbing," while the phlebotomist was trying to get blood. Patient states that it otherwise feels okay and the site looks okay at this time but that she has pain in the palm of her hand - she states that the impacted vein travels from her [facility] to the palm of her hand and she is concerned that this is a related "injury." Offered for patient to come in for a provider to assess but the patient states that she will continue to monitor and let us know of worsening symptoms. Encouraged patient to call at the first sign of worsening symptoms. Also discussed rotating venipuncture sites with each lab appointment. Patient states that she was unaware that sites could rotate and she will ask that they rotate moving forward. All questions addressed and answered to the patient's satisfaction. ?

## 2021-09-16 ENCOUNTER — Inpatient Hospital Stay (HOSPITAL_COMMUNITY): Payer: Medicare HMO

## 2021-09-16 ENCOUNTER — Other Ambulatory Visit (HOSPITAL_COMMUNITY): Payer: Self-pay | Admitting: Physician Assistant

## 2021-09-16 DIAGNOSIS — D696 Thrombocytopenia, unspecified: Secondary | ICD-10-CM

## 2021-09-16 DIAGNOSIS — D649 Anemia, unspecified: Secondary | ICD-10-CM

## 2021-09-16 DIAGNOSIS — N1831 Chronic kidney disease, stage 3a: Secondary | ICD-10-CM

## 2021-09-16 DIAGNOSIS — I129 Hypertensive chronic kidney disease with stage 1 through stage 4 chronic kidney disease, or unspecified chronic kidney disease: Secondary | ICD-10-CM | POA: Diagnosis not present

## 2021-09-16 LAB — CBC
HCT: 35.9 % — ABNORMAL LOW (ref 36.0–46.0)
Hemoglobin: 11.7 g/dL — ABNORMAL LOW (ref 12.0–15.0)
MCH: 28.8 pg (ref 26.0–34.0)
MCHC: 32.6 g/dL (ref 30.0–36.0)
MCV: 88.4 fL (ref 80.0–100.0)
Platelets: 105 10*3/uL — ABNORMAL LOW (ref 150–400)
RBC: 4.06 MIL/uL (ref 3.87–5.11)
RDW: 15.3 % (ref 11.5–15.5)
WBC: 4.2 10*3/uL (ref 4.0–10.5)
nRBC: 0 % (ref 0.0–0.2)

## 2021-09-16 NOTE — Progress Notes (Signed)
Hgb 11.7 today.  No injection needed.  Patient discharged with no complaints voiced.  ?

## 2021-09-16 NOTE — Patient Instructions (Signed)
[facility]  Discharge Instructions: Thank you for choosing [facility] to provide your oncology and hematology care.  If you have a lab appointment with the Cancer Center, please come in thru the Main Entrance and check in at the main information desk.  Wear comfortable clothing and clothing appropriate for easy access to any Portacath or PICC line.   We strive to give you quality time with your provider. You may need to reschedule your appointment if you arrive late (15 or more minutes).  Arriving late affects you and other patients whose appointments are after yours.  Also, if you miss three or more appointments without notifying the office, you may be dismissed from the clinic at the provider's discretion.      For prescription refill requests, have your pharmacy contact our office and allow 72 hours for refills to be completed.        To help prevent nausea and vomiting after your treatment, we encourage you to take your nausea medication as directed.  BELOW ARE SYMPTOMS THAT SHOULD BE REPORTED IMMEDIATELY: *FEVER GREATER THAN 100.4 F (38 C) OR HIGHER *CHILLS OR SWEATING *NAUSEA AND VOMITING THAT IS NOT CONTROLLED WITH YOUR NAUSEA MEDICATION *UNUSUAL SHORTNESS OF BREATH *UNUSUAL BRUISING OR BLEEDING *URINARY PROBLEMS (pain or burning when urinating, or frequent urination) *BOWEL PROBLEMS (unusual diarrhea, constipation, pain near the anus) TENDERNESS IN MOUTH AND THROAT WITH OR WITHOUT PRESENCE OF ULCERS (sore throat, sores in mouth, or a toothache) UNUSUAL RASH, SWELLING OR PAIN  UNUSUAL VAGINAL DISCHARGE OR ITCHING   Items with * indicate a potential emergency and should be followed up as soon as possible or go to the Emergency Department if any problems should occur.  Please show the CHEMOTHERAPY ALERT CARD or IMMUNOTHERAPY ALERT CARD at check-in to the Emergency Department and triage nurse.  Should you have questions after your visit or need to cancel  or reschedule your appointment, please contact  CANCER CENTER [phone]  and follow the prompts.  Office hours are 8:00 a.m. to 4:30 p.m. Monday - Friday. Please note that voicemails left after 4:00 p.m. may not be returned until the following business day.  We are closed weekends and major holidays. You have access to a nurse at all times for urgent questions. Please call the main number to the clinic [phone] and follow the prompts.  For any non-urgent questions, you may also contact your provider using MyChart. We now offer e-Visits for anyone 18 and older to request care online for non-urgent symptoms. For details visit [email or web address].   Also download the MyChart app! Go to the app store, search "MyChart", open the app, select [facility], and log in with your MyChart username and password.  Due to Covid, a mask is required upon entering the hospital/clinic. If you do not have a mask, one will be given to you upon arrival. For doctor visits, patients may have 1 support person aged 18 or older with them. For treatment visits, patients cannot have anyone with them due to current Covid guidelines and our immunocompromised population.  

## 2021-09-30 ENCOUNTER — Inpatient Hospital Stay (HOSPITAL_COMMUNITY): Payer: Medicare HMO | Attending: Hematology

## 2021-09-30 ENCOUNTER — Inpatient Hospital Stay (HOSPITAL_COMMUNITY): Payer: Medicare HMO

## 2021-09-30 VITALS — BP 138/82 | HR 63 | Temp 97.5°F | Resp 18

## 2021-09-30 DIAGNOSIS — D631 Anemia in chronic kidney disease: Secondary | ICD-10-CM | POA: Diagnosis present

## 2021-09-30 DIAGNOSIS — I10 Essential (primary) hypertension: Secondary | ICD-10-CM | POA: Diagnosis not present

## 2021-09-30 DIAGNOSIS — D649 Anemia, unspecified: Secondary | ICD-10-CM

## 2021-09-30 DIAGNOSIS — N1831 Chronic kidney disease, stage 3a: Secondary | ICD-10-CM

## 2021-09-30 DIAGNOSIS — D696 Thrombocytopenia, unspecified: Secondary | ICD-10-CM | POA: Diagnosis not present

## 2021-09-30 DIAGNOSIS — D509 Iron deficiency anemia, unspecified: Secondary | ICD-10-CM | POA: Diagnosis not present

## 2021-09-30 DIAGNOSIS — Z79899 Other long term (current) drug therapy: Secondary | ICD-10-CM | POA: Diagnosis not present

## 2021-09-30 DIAGNOSIS — N183 Chronic kidney disease, stage 3 unspecified: Secondary | ICD-10-CM | POA: Insufficient documentation

## 2021-09-30 LAB — FOLATE: Folate: 23.3 ng/mL (ref >5.9)

## 2021-09-30 LAB — CBC WITH DIFFERENTIAL/PLATELET
Abs Immature Granulocytes: 0.01 10*3/uL (ref 0.00–0.07)
Basophils Absolute: 0 10*3/uL (ref 0.0–0.1)
Basophils Relative: 1 %
Eosinophils Absolute: 0.1 10*3/uL (ref 0.0–0.5)
Eosinophils Relative: 2 %
HCT: 33.3 % — ABNORMAL LOW (ref 36.0–46.0)
Hemoglobin: 10.8 g/dL — ABNORMAL LOW (ref 12.0–15.0)
Immature Granulocytes: 0 %
Lymphocytes Relative: 33 %
Lymphs Abs: 1.3 10*3/uL (ref 0.7–4.0)
MCH: 28.8 pg (ref 26.0–34.0)
MCHC: 32.4 g/dL (ref 30.0–36.0)
MCV: 88.8 fL (ref 80.0–100.0)
Monocytes Absolute: 0.3 10*3/uL (ref 0.1–1.0)
Monocytes Relative: 8 %
Neutro Abs: 2.2 10*3/uL (ref 1.7–7.7)
Neutrophils Relative %: 56 %
Platelets: 127 10*3/uL — ABNORMAL LOW (ref 150–400)
RBC: 3.75 MIL/uL — ABNORMAL LOW (ref 3.87–5.11)
RDW: 15.6 % — ABNORMAL HIGH (ref 11.5–15.5)
WBC: 3.9 10*3/uL — ABNORMAL LOW (ref 4.0–10.5)
nRBC: 0 % (ref 0.0–0.2)

## 2021-09-30 LAB — COMPREHENSIVE METABOLIC PANEL
ALT: 23 U/L (ref 0–44)
AST: 24 U/L (ref 15–41)
Albumin: 4.1 g/dL (ref 3.5–5.0)
Alkaline Phosphatase: 35 U/L — ABNORMAL LOW (ref 38–126)
Anion gap: 5 (ref 5–15)
BUN: 27 mg/dL — ABNORMAL HIGH (ref 8–23)
CO2: 27 mmol/L (ref 22–32)
Calcium: 9.5 mg/dL (ref 8.9–10.3)
Chloride: 108 mmol/L (ref 98–111)
Creatinine, Ser: 1.37 mg/dL — ABNORMAL HIGH (ref 0.44–1.00)
GFR, Estimated: 41 mL/min — ABNORMAL LOW (ref >60)
Glucose, Bld: 102 mg/dL — ABNORMAL HIGH (ref 70–99)
Potassium: 4.4 mmol/L (ref 3.5–5.1)
Sodium: 140 mmol/L (ref 135–145)
Total Bilirubin: 0.8 mg/dL (ref 0.3–1.2)
Total Protein: 6.8 g/dL (ref 6.5–8.1)

## 2021-09-30 LAB — IRON AND TIBC
Iron: 65 ug/dL (ref 28–170)
Saturation Ratios: 26 % (ref 10.4–31.8)
TIBC: 251 ug/dL (ref 250–450)
UIBC: 186 ug/dL

## 2021-09-30 LAB — VITAMIN B12: Vitamin B-12: 1085 pg/mL — ABNORMAL HIGH (ref 180–914)

## 2021-09-30 LAB — FERRITIN: Ferritin: 463 ng/mL — ABNORMAL HIGH (ref 11–307)

## 2021-09-30 MED ORDER — EPOETIN ALFA-EPBX 20000 UNIT/ML IJ SOLN
20000.0000 [IU] | Freq: Once | INTRAMUSCULAR | Status: AC
  Administered 2021-09-30: 20000 [IU] via SUBCUTANEOUS
  Filled 2021-09-30: qty 1

## 2021-09-30 NOTE — Progress Notes (Signed)
Jennifer Li presents today for injection per the provider's orders.  Retacrit injection administration without incident; injection site WNL; see MAR for injection details.  Patient tolerated procedure well and without incident.  No questions or complaints noted at this time. Pt's hemoglobin noted to be 10.8 today. ? ?Discharged from clinic ambulatory in stable condition. Alert and oriented x 3. F/U with [facility] as scheduled.   ?

## 2021-09-30 NOTE — Patient Instructions (Signed)
[facility]  Discharge Instructions: Thank you for choosing [facility] to provide your oncology and hematology care.  If you have a lab appointment with the Cancer Center, please come in thru the Main Entrance and check in at the main information desk.  Wear comfortable clothing and clothing appropriate for easy access to any Portacath or PICC line.   We strive to give you quality time with your provider. You may need to reschedule your appointment if you arrive late (15 or more minutes).  Arriving late affects you and other patients whose appointments are after yours.  Also, if you miss three or more appointments without notifying the office, you may be dismissed from the clinic at the provider's discretion.      For prescription refill requests, have your pharmacy contact our office and allow 72 hours for refills to be completed.    Today you received Retacrit injection  BELOW ARE SYMPTOMS THAT SHOULD BE REPORTED IMMEDIATELY: *FEVER GREATER THAN 100.4 F (38 C) OR HIGHER *CHILLS OR SWEATING *NAUSEA AND VOMITING THAT IS NOT CONTROLLED WITH YOUR NAUSEA MEDICATION *UNUSUAL SHORTNESS OF BREATH *UNUSUAL BRUISING OR BLEEDING *URINARY PROBLEMS (pain or burning when urinating, or frequent urination) *BOWEL PROBLEMS (unusual diarrhea, constipation, pain near the anus) TENDERNESS IN MOUTH AND THROAT WITH OR WITHOUT PRESENCE OF ULCERS (sore throat, sores in mouth, or a toothache) UNUSUAL RASH, SWELLING OR PAIN  UNUSUAL VAGINAL DISCHARGE OR ITCHING   Items with * indicate a potential emergency and should be followed up as soon as possible or go to the Emergency Department if any problems should occur.  Please show the CHEMOTHERAPY ALERT CARD or IMMUNOTHERAPY ALERT CARD at check-in to the Emergency Department and triage nurse.  Should you have questions after your visit or need to cancel or reschedule your appointment, please contact [facility] [phone]   and follow the prompts.  Office hours are 8:00 a.m. to 4:30 p.m. Monday - Friday. Please note that voicemails left after 4:00 p.m. may not be returned until the following business day.  We are closed weekends and major holidays. You have access to a nurse at all times for urgent questions. Please call the main number to the clinic [phone] and follow the prompts.  For any non-urgent questions, you may also contact your provider using MyChart. We now offer e-Visits for anyone 18 and older to request care online for non-urgent symptoms. For details visit [email or web address].   Also download the MyChart app! Go to the app store, search "MyChart", open the app, select [facility], and log in with your MyChart username and password.  Due to Covid, a mask is required upon entering the hospital/clinic. If you do not have a mask, one will be given to you upon arrival. For doctor visits, patients may have 1 support person aged 18 or older with them. For treatment visits, patients cannot have anyone with them due to current Covid guidelines and our immunocompromised population.  

## 2021-10-01 LAB — HOMOCYSTEINE: Homocysteine: 14.7 umol/L (ref 0.0–19.2)

## 2021-10-02 LAB — COPPER, SERUM: Copper: 97 ug/dL (ref 80–158)

## 2021-10-03 LAB — METHYLMALONIC ACID, SERUM: Methylmalonic Acid, Quantitative: 259 nmol/L (ref 0–378)

## 2021-10-14 NOTE — Progress Notes (Addendum)
[address] [place], [place] [address]   CLINIC:  Medical Oncology/Hematology  PCP:  System, Provider Not In No address on file None   REASON FOR VISIT:  Follow-up for iron deficiency anemia and anemia of CKD   PRIOR THERAPY: None   CURRENT THERAPY: Intermittent IV iron infusions, Retacrit 20,000 units every 2   INTERVAL HISTORY:  Jennifer Li 71 y.o. female returns for routine follow-up of her anemia of CKD.  She was last seen by Tarri Abernethy PA-C on 07/22/2021.  She continues to tolerate her Retacrit injections well.  She has not noticed any signs or symptoms of DVT or PE.  Blood pressure is at baseline.  She has not noticed any bleeding such as hematemesis, hematochezia, melena, or epistaxis.  She reports that her energy is somewhat better with her improved hemoglobin.  She denies any pica, restless legs, chest pain, dyspnea on exertion, lightheadedness, or syncope.  She does have occasional headaches.  Regarding her mild thrombocytopenia, she denies any symptoms of bruising, bleeding, or petechial rash.  She does not have any known history of liver disease, autoimmune disease, or connective tissue disorder.  She does report that she takes several herbal supplements at home, including occasionally drinking dandelion tea and using red clover supplements.  She denies any quinine containing supplements.  She has 75% energy and 100% appetite. She endorses that she is maintaining a stable weight.   REVIEW OF SYSTEMS:  Review of Systems  Constitutional:  Positive for fatigue (Mild). Negative for appetite change, chills, diaphoresis, fever and unexpected weight change.  HENT:   Positive for trouble swallowing (Difficulty chewing). Negative for lump/mass and nosebleeds.   Eyes:  Negative for eye problems.  Respiratory:  Negative for cough, hemoptysis and shortness of breath.   Cardiovascular:  Negative for chest pain, leg swelling and palpitations.   Gastrointestinal:  Negative for abdominal pain, blood in stool, constipation, diarrhea, nausea and vomiting.  Genitourinary:  Negative for hematuria.   Skin: Negative.   Neurological:  Positive for headaches (Occasional). Negative for dizziness and light-headedness.  Hematological:  Does not bruise/bleed easily.     PAST MEDICAL/SURGICAL HISTORY:  Past Medical History:  Diagnosis Date   Arthritis    Cataract    Chronic kidney disease    stage 3   Heart murmur    Past Surgical History:  Procedure Laterality Date   ABDOMINAL HYSTERECTOMY     CATARACT EXTRACTION, BILATERAL     gaglion cyst removal     MYOMECTOMY       SOCIAL HISTORY:  Social History   Socioeconomic History   Marital status: Widowed    Spouse name: Not on file   Number of children: Not on file   Years of education: Not on file   Highest education level: Not on file  Occupational History   Not on file  Tobacco Use   Smoking status: Former   Smokeless tobacco: Never  Substance and Sexual Activity   Alcohol use: Never   Drug use: Never   Sexual activity: Not Currently  Other Topics Concern   Not on file  Social History Narrative   Not on file   Social Determinants of Health   Financial Resource Strain: Not on file  Food Insecurity: Not on file  Transportation Needs: Not on file  Physical Activity: Not on file  Stress: Not on file  Social Connections: Not on file  Intimate Partner Violence: Not on file  FAMILY HISTORY:  Family History  Problem Relation Age of Onset   Diabetes Sister    Diabetes Son     CURRENT MEDICATIONS:  Outpatient Encounter Medications as of 10/15/2021  Medication Sig   ascorbic acid (VITAMIN C) 1000 MG tablet Take by mouth.   ascorbic acid (VITAMIN C) 1000 MG tablet Vitamin C 1,000 mg tablet  Take 1 tablet every day by oral route.   aspirin EC 81 MG tablet Take 81 mg by mouth daily. Swallow whole.   cholecalciferol (VITAMIN D3) 25 MCG (1000 UNIT) tablet Take  1,000 Units by mouth daily.   Cholecalciferol 25 MCG (1000 UT) capsule 1 capsule 1 (one) time each day   cloNIDine (CATAPRES) 0.1 MG tablet clonidine HCl 0.1 mg tablet   cloNIDine (CATAPRES) 0.1 MG tablet Take by mouth.   losartan (COZAAR) 100 MG tablet losartan 100 mg tablet   metoprolol tartrate (LOPRESSOR) 100 MG tablet metoprolol tartrate 100 mg tablet   nitroGLYCERIN (NITROSTAT) 0.4 MG SL tablet nitroglycerin 0.4 mg sublingual tablet   spironolactone (ALDACTONE) 25 MG tablet spironolactone 25 mg tablet   triamcinolone ointment (KENALOG) 0.1 % Apply topically 2 (two) times daily.   No facility-administered encounter medications on file as of 10/15/2021.    ALLERGIES:  Allergies  Allergen Reactions   Escitalopram    Gemfibrozil Hives and Other (See Comments)   Sulfa Antibiotics Hives   Sulfur    Trazodone      PHYSICAL EXAM:  ECOG PERFORMANCE STATUS: 0 - Asymptomatic  There were no vitals filed for this visit. There were no vitals filed for this visit. Physical Exam Constitutional:      Appearance: Normal appearance.  HENT:     Head: Normocephalic and atraumatic.     Mouth/Throat:     Mouth: Mucous membranes are moist.  Eyes:     Extraocular Movements: Extraocular movements intact.     Pupils: Pupils are equal, round, and reactive to light.  Cardiovascular:     Rate and Rhythm: Normal rate and regular rhythm.     Pulses: Normal pulses.     Heart sounds: Normal heart sounds.  Pulmonary:     Effort: Pulmonary effort is normal.     Breath sounds: Normal breath sounds.  Abdominal:     General: Bowel sounds are normal.     Palpations: Abdomen is soft.     Tenderness: There is no abdominal tenderness.  Musculoskeletal:        General: No swelling.     Right lower leg: No edema.     Left lower leg: No edema.  Lymphadenopathy:     Cervical: No cervical adenopathy.  Skin:    General: Skin is warm and dry.  Neurological:     General: No focal deficit present.      Mental Status: She is alert and oriented to person, place, and time.  Psychiatric:        Mood and Affect: Mood normal.        Behavior: Behavior normal.     LABORATORY DATA:  I have reviewed the labs as listed.  CBC    Component Value Date/Time   WBC 3.9 (L) 09/30/2021 1005   RBC 3.75 (L) 09/30/2021 1005   HGB 10.8 (L) 09/30/2021 1005   HCT 33.3 (L) 09/30/2021 1005   HCT 29.8 (L) 06/20/2020 1315   PLT 127 (L) 09/30/2021 1005   MCV 88.8 09/30/2021 1005   MCH 28.8 09/30/2021 1005   MCHC 32.4 09/30/2021 1005  RDW 15.6 (H) 09/30/2021 1005   LYMPHSABS 1.3 09/30/2021 1005   MONOABS 0.3 09/30/2021 1005   EOSABS 0.1 09/30/2021 1005   BASOSABS 0.0 09/30/2021 1005      Latest Ref Rng & Units 09/30/2021   10:05 AM 06/11/2021    2:43 PM 05/18/2021    1:19 PM  CMP  Glucose 70 - 99 mg/dL 102   101   96    BUN 8 - 23 mg/dL 27   29   28     Creatinine 0.44 - 1.00 mg/dL 1.37   1.36   1.16    Sodium 135 - 145 mmol/L 140   139   140    Potassium 3.5 - 5.1 mmol/L 4.4   4.7   4.0    Chloride 98 - 111 mmol/L 108   107   109    CO2 22 - 32 mmol/L 27   26   26     Calcium 8.9 - 10.3 mg/dL 9.5   9.4   9.6    Total Protein 6.5 - 8.1 g/dL 6.8    6.4    Total Bilirubin 0.3 - 1.2 mg/dL 0.8    0.8    Alkaline Phos 38 - 126 U/L 35    30    AST 15 - 41 U/L 24    17    ALT 0 - 44 U/L 23    13      DIAGNOSTIC IMAGING:  I have independently reviewed the relevant imaging and discussed with the patient.  ASSESSMENT & PLAN: 1.  Anemia of CKD and iron deficiency - EGD and colonoscopy in March 2021 by Dr. Phillip Heal showed mild gastritis, diverticulosis, internal and external hemorrhoids, 1 polyp which was noncancerous. - Capsule endoscopy in September 2021 - Bone marrow biopsy on 08/07/2020, normocellular marrow with trilineage hematopoiesis.  Mild polytypic plasmacytosis.  Chromosome analysis was normal.  SPEP was negative.  Free light chain ratio was normal with elevated kappa light chains consistent with  CKD. - No prior history of blood transfusions.  No family history of sickle cell or thalassemia. - She received 1 g Venofer from 02/23/2021 - 03/09/2021 - She stopped iron tablet due to lack of improvement. - She denies any bleeding per rectum or melena.   - She was started on Retacrit in December 2022, current dose is 20,000 units Q2 weeks - Most recent labs (09/30/2021): Hgb 10.8/MCV 88.8, ferritin 463, iron saturation 26% - PLAN: Decrease Retacrit frequency to 20,000 units every 4 weeks.   - Labs and RTC in 3 months.   2.  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 - Lowest platelets 105 on 09/16/2021 - Most recent CBC (10/15/2021) with near normal platelets 141 - Nutritional panel (09/30/2021): Normal B12, folate, copper - She takes red Clover and drinks occasional dandelion tea.  She denies any quinine containing supplements or tonic water. - No known history of liver disease, autoimmune disorder, or connective tissue disorder  - She denies any bleeding, bruising, petechial rash  - Differential diagnosis includes possible mild chronic ITP - PLAN: No indication for treatment at this time.  We will continue to monitor with CBCs as above.      3.  Hypertension - Blood pressure is managed by her nephrologist (Dr. Elita Quick) - She is taking losartan 100 mg, Catapres 0.1 mg, Aldactone 25 mg, and Lopressor 100 mg  -  Blood pressure today was 158/84, but she reports that SBP at home is sometimes greater than 200 in situations of stress - PLAN: Encouraged ongoing follow-up with her other specialists for management of her hypertension.   4.  Stage IIIa/b CKD: - She follows with Dr. Elita Quick in [place], [place]   All questions were answered. The patient knows to call the clinic with any problems, questions or concerns.  Medical decision making:  Moderate  Time spent on visit: I spent 20 minutes counseling the patient face to face. The total time spent in the appointment was 30 minutes and more than 50% was on counseling.   Jennifer Rush, PA-C  10/15/2021 11:50 PM

## 2021-10-15 ENCOUNTER — Inpatient Hospital Stay (HOSPITAL_COMMUNITY): Payer: Medicare HMO

## 2021-10-15 ENCOUNTER — Inpatient Hospital Stay (HOSPITAL_COMMUNITY): Payer: Medicare HMO | Admitting: Physician Assistant

## 2021-10-15 VITALS — BP 158/84 | HR 61 | Temp 97.6°F | Resp 16 | Ht 61.42 in | Wt 117.1 lb

## 2021-10-15 DIAGNOSIS — D649 Anemia, unspecified: Secondary | ICD-10-CM

## 2021-10-15 DIAGNOSIS — D696 Thrombocytopenia, unspecified: Secondary | ICD-10-CM | POA: Diagnosis not present

## 2021-10-15 DIAGNOSIS — N1831 Chronic kidney disease, stage 3a: Secondary | ICD-10-CM

## 2021-10-15 DIAGNOSIS — N183 Chronic kidney disease, stage 3 unspecified: Secondary | ICD-10-CM | POA: Diagnosis not present

## 2021-10-15 LAB — CBC
HCT: 34.8 % — ABNORMAL LOW (ref 36.0–46.0)
Hemoglobin: 11.1 g/dL — ABNORMAL LOW (ref 12.0–15.0)
MCH: 28.2 pg (ref 26.0–34.0)
MCHC: 31.9 g/dL (ref 30.0–36.0)
MCV: 88.3 fL (ref 80.0–100.0)
Platelets: 141 10*3/uL — ABNORMAL LOW (ref 150–400)
RBC: 3.94 MIL/uL (ref 3.87–5.11)
RDW: 16.2 % — ABNORMAL HIGH (ref 11.5–15.5)
WBC: 3.5 10*3/uL — ABNORMAL LOW (ref 4.0–10.5)
nRBC: 0 % (ref 0.0–0.2)

## 2021-10-15 NOTE — Patient Instructions (Signed)
[facility] at [facility] Discharge Instructions  You were seen today by Rojelio Brenner PA-C for your anemia.  Your blood counts have improved.  We will decrease how often we give you Retacrit injections 20,000 units to every 4 weeks.  We will continue to have your blood checked on the same day as your Retacrit injections.  FOLLOW-UP APPOINTMENT: Office visit in 3 months   Thank you for choosing [facility] at [facility] to provide your oncology and hematology care.  To afford each patient quality time with our provider, please arrive at least 15 minutes before your scheduled appointment time.   If you have a lab appointment with the Cancer Center please come in thru the Main Entrance and check in at the main information desk.  You need to re-schedule your appointment should you arrive 10 or more minutes late.  We strive to give you quality time with our providers, and arriving late affects you and other patients whose appointments are after yours.  Also, if you no show three or more times for appointments you may be dismissed from the clinic at the providers discretion.     Again, thank you for choosing [facility].  Our hope is that these requests will decrease the amount of time that you wait before being seen by our physicians.       _____________________________________________________________  Should you have questions after your visit to [facility], please contact our office at [phone] and follow the prompts.  Our office hours are 8:00 a.m. and 4:30 p.m. Monday - Friday.  Please note that voicemails left after 4:00 p.m. may not be returned until the following business day.  We are closed weekends and major holidays.  You do have access to a nurse 24-7, just call the main number to the clinic [phone] and do not press any options, hold on the line and a nurse will answer the phone.    For prescription  refill requests, have your pharmacy contact our office and allow 72 hours.    Due to Covid, you will need to wear a mask upon entering the hospital. If you do not have a mask, a mask will be given to you at the Main Entrance upon arrival. For doctor visits, patients may have 1 support person age 12 or older with them. For treatment visits, patients can not have anyone with them due to social distancing guidelines and our immunocompromised population.

## 2021-10-15 NOTE — Progress Notes (Signed)
Hemoglobin 11.1, no injection needed today. 

## 2021-10-16 ENCOUNTER — Encounter (HOSPITAL_COMMUNITY): Payer: Self-pay | Admitting: Hematology

## 2021-10-27 ENCOUNTER — Encounter (HOSPITAL_COMMUNITY): Payer: Self-pay | Admitting: *Deleted

## 2021-11-11 ENCOUNTER — Encounter (HOSPITAL_COMMUNITY): Payer: Self-pay

## 2021-11-11 ENCOUNTER — Inpatient Hospital Stay (HOSPITAL_COMMUNITY): Payer: Medicare HMO | Attending: Hematology

## 2021-11-11 ENCOUNTER — Inpatient Hospital Stay (HOSPITAL_COMMUNITY): Payer: Medicare HMO

## 2021-11-11 VITALS — BP 119/81 | HR 63 | Temp 97.5°F | Resp 18

## 2021-11-11 DIAGNOSIS — D631 Anemia in chronic kidney disease: Secondary | ICD-10-CM | POA: Diagnosis present

## 2021-11-11 DIAGNOSIS — N183 Chronic kidney disease, stage 3 unspecified: Secondary | ICD-10-CM | POA: Insufficient documentation

## 2021-11-11 DIAGNOSIS — D649 Anemia, unspecified: Secondary | ICD-10-CM

## 2021-11-11 DIAGNOSIS — N1831 Chronic kidney disease, stage 3a: Secondary | ICD-10-CM

## 2021-11-11 LAB — CBC
HCT: 30.8 % — ABNORMAL LOW (ref 36.0–46.0)
Hemoglobin: 10 g/dL — ABNORMAL LOW (ref 12.0–15.0)
MCH: 28.8 pg (ref 26.0–34.0)
MCHC: 32.5 g/dL (ref 30.0–36.0)
MCV: 88.8 fL (ref 80.0–100.0)
Platelets: 141 10*3/uL — ABNORMAL LOW (ref 150–400)
RBC: 3.47 MIL/uL — ABNORMAL LOW (ref 3.87–5.11)
RDW: 16.9 % — ABNORMAL HIGH (ref 11.5–15.5)
WBC: 4.2 10*3/uL (ref 4.0–10.5)
nRBC: 0 % (ref 0.0–0.2)

## 2021-11-11 MED ORDER — EPOETIN ALFA-EPBX 20000 UNIT/ML IJ SOLN
20000.0000 [IU] | Freq: Once | INTRAMUSCULAR | Status: AC
  Administered 2021-11-11: 20000 [IU] via SUBCUTANEOUS
  Filled 2021-11-11: qty 1

## 2021-11-11 NOTE — Patient Instructions (Signed)
[facility]  Discharge Instructions: Thank you for choosing [facility] to provide your oncology and hematology care.  If you have a lab appointment with the Cancer Center, please come in thru the Main Entrance and check in at the main information desk.  Wear comfortable clothing and clothing appropriate for easy access to any Portacath or PICC line.   We strive to give you quality time with your provider. You may need to reschedule your appointment if you arrive late (15 or more minutes).  Arriving late affects you and other patients whose appointments are after yours.  Also, if you miss three or more appointments without notifying the office, you may be dismissed from the clinic at the provider's discretion.      For prescription refill requests, have your pharmacy contact our office and allow 72 hours for refills to be completed.    Today you received the following chemotherapy and/or immunotherapy agents retacrit.  Epoetin Alfa injection What is this medication? EPOETIN ALFA (e POE e tin AL fa) helps your body make more red blood cells. This medicine is used to treat anemia caused by chronic kidney disease, cancer chemotherapy, or HIV-therapy. It may also be used before surgery if you have anemia. This medicine may be used for other purposes; ask your health care provider or pharmacist if you have questions. COMMON BRAND NAME(S): Epogen, Procrit, Retacrit What should I tell my care team before I take this medication? They need to know if you have any of these conditions: cancer heart disease high blood pressure history of blood clots history of stroke low levels of folate, iron, or vitamin B12 in the blood seizures an unusual or allergic reaction to erythropoietin, albumin, benzyl alcohol, hamster proteins, other medicines, foods, dyes, or preservatives pregnant or trying to get pregnant breast-feeding How should I use this medication? This medicine is for  injection into a vein or under the skin. It is usually given by a health care professional in a hospital or clinic setting. If you get this medicine at home, you will be taught how to prepare and give this medicine. Use exactly as directed. Take your medicine at regular intervals. Do not take your medicine more often than directed. It is important that you put your used needles and syringes in a special sharps container. Do not put them in a trash can. If you do not have a sharps container, call your pharmacist or healthcare provider to get one. A special MedGuide will be given to you by the pharmacist with each prescription and refill. Be sure to read this information carefully each time. Talk to your pediatrician regarding the use of this medicine in children. While this drug may be prescribed for selected conditions, precautions do apply. Overdosage: If you think you have taken too much of this medicine contact a poison control center or emergency room at once. NOTE: This medicine is only for you. Do not share this medicine with others. What if I miss a dose? If you miss a dose, take it as soon as you can. If it is almost time for your next dose, take only that dose. Do not take double or extra doses. What may interact with this medication? Interactions have not been studied. This list may not describe all possible interactions. Give your health care provider a list of all the medicines, herbs, non-prescription drugs, or dietary supplements you use. Also tell them if you smoke, drink alcohol, or use illegal drugs. Some   items may interact with your medicine. What should I watch for while using this medication? Your condition will be monitored carefully while you are receiving this medicine. You may need blood work done while you are taking this medicine. This medicine may cause a decrease in vitamin B6. You should make sure that you get enough vitamin B6 while you are taking this medicine. Discuss  the foods you eat and the vitamins you take with your health care professional. What side effects may I notice from receiving this medication? Side effects that you should report to your doctor or health care professional as soon as possible: allergic reactions like skin rash, itching or hives, swelling of the face, lips, or tongue seizures signs and symptoms of a blood clot such as breathing problems; changes in vision; chest pain; severe, sudden headache; pain, swelling, warmth in the leg; trouble speaking; sudden numbness or weakness of the face, arm or leg signs and symptoms of a stroke like changes in vision; confusion; trouble speaking or understanding; severe headaches; sudden numbness or weakness of the face, arm or leg; trouble walking; dizziness; loss of balance or coordination Side effects that usually do not require medical attention (report to your doctor or health care professional if they continue or are bothersome): chills cough dizziness fever headaches joint pain muscle cramps muscle pain nausea, vomiting pain, redness, or irritation at site where injected This list may not describe all possible side effects. Call your doctor for medical advice about side effects. You may report side effects to FDA at 1-800-FDA-1088. Where should I keep my medication? Keep out of the reach of children. Store in a refrigerator between 2 and 8 degrees C (36 and 46 degrees F). Do not freeze or shake. Throw away any unused portion if using a single-dose vial. Multi-dose vials can be kept in the refrigerator for up to 21 days after the initial dose. Throw away unused medicine. NOTE: This sheet is a summary. It may not cover all possible information. If you have questions about this medicine, talk to your doctor, pharmacist, or health care provider.  2023 Elsevier/Gold Standard (2017-01-18 00:00:00)       To help prevent nausea and vomiting after your treatment, we encourage you to take your  nausea medication as directed.  BELOW ARE SYMPTOMS THAT SHOULD BE REPORTED IMMEDIATELY: *FEVER GREATER THAN 100.4 F (38 C) OR HIGHER *CHILLS OR SWEATING *NAUSEA AND VOMITING THAT IS NOT CONTROLLED WITH YOUR NAUSEA MEDICATION *UNUSUAL SHORTNESS OF BREATH *UNUSUAL BRUISING OR BLEEDING *URINARY PROBLEMS (pain or burning when urinating, or frequent urination) *BOWEL PROBLEMS (unusual diarrhea, constipation, pain near the anus) TENDERNESS IN MOUTH AND THROAT WITH OR WITHOUT PRESENCE OF ULCERS (sore throat, sores in mouth, or a toothache) UNUSUAL RASH, SWELLING OR PAIN  UNUSUAL VAGINAL DISCHARGE OR ITCHING   Items with * indicate a potential emergency and should be followed up as soon as possible or go to the Emergency Department if any problems should occur.  Please show the CHEMOTHERAPY ALERT CARD or IMMUNOTHERAPY ALERT CARD at check-in to the Emergency Department and triage nurse.  Should you have questions after your visit or need to cancel or reschedule your appointment, please contact [facility] [phone]  and follow the prompts.  Office hours are 8:00 a.m. to 4:30 p.m. Monday - Friday. Please note that voicemails left after 4:00 p.m. may not be returned until the following business day.  We are closed weekends and major holidays. You have access to a nurse   at all times for urgent questions. Please call the main number to the clinic [phone] and follow the prompts.  For any non-urgent questions, you may also contact your provider using MyChart. We now offer e-Visits for anyone 18 and older to request care online for non-urgent symptoms. For details visit [email or web address].   Also download the MyChart app! Go to the app store, search "MyChart", open the app, select [facility], and log in with your MyChart username and password.  Masks are optional in the cancer centers. If you would like for your care team to wear a mask while they are taking care of you, please let  them know. For doctor visits, patients may have with them one support person who is at least 73 years old. At this time, visitors are not allowed in the infusion area.  

## 2021-11-11 NOTE — Progress Notes (Signed)
Patient presents today for Retacrit injection. Hemoglobin reviewed prior to administration. VSS. Injection tolerated without incident or complaint. See MAR for details. Patient stable during and after injection.  Patient discharged in satisfactory condition with no s/s of distress noted.    

## 2021-11-16 ENCOUNTER — Telehealth (HOSPITAL_COMMUNITY): Payer: Self-pay | Admitting: *Deleted

## 2021-11-16 NOTE — Telephone Encounter (Signed)
Patient called to advise that CR 1.6 and GFR 60 and was told by nurse at Dr. Raphael Gibney to go to the ER immediately.  Patient has been on 2 different antibiotics, having 5 more days left on the second.  Made her aware that antibiotics will effect these values and should return to baseline, once she completes her course, as her labs are chronically elevated.  She is having no negative symptoms at this point, however will call if anything develops.  Verbalized understanding.

## 2021-11-16 NOTE — Telephone Encounter (Addendum)
Patient called back at 3:30 pm stating that she decided to present to the ER.  Advised that they are admitting her for observation and administering fluids.

## 2021-11-27 ENCOUNTER — Other Ambulatory Visit (HOSPITAL_COMMUNITY): Payer: Self-pay | Admitting: Physician Assistant

## 2021-11-27 DIAGNOSIS — N1831 Chronic kidney disease, stage 3a: Secondary | ICD-10-CM

## 2021-12-09 ENCOUNTER — Inpatient Hospital Stay (HOSPITAL_COMMUNITY): Payer: Medicare HMO | Attending: Hematology

## 2021-12-09 ENCOUNTER — Inpatient Hospital Stay (HOSPITAL_COMMUNITY): Payer: Medicare HMO

## 2021-12-09 VITALS — BP 150/91 | HR 60 | Temp 96.9°F | Resp 18 | Wt 119.2 lb

## 2021-12-09 DIAGNOSIS — N1831 Chronic kidney disease, stage 3a: Secondary | ICD-10-CM

## 2021-12-09 DIAGNOSIS — D631 Anemia in chronic kidney disease: Secondary | ICD-10-CM | POA: Diagnosis present

## 2021-12-09 DIAGNOSIS — D649 Anemia, unspecified: Secondary | ICD-10-CM

## 2021-12-09 DIAGNOSIS — N183 Chronic kidney disease, stage 3 unspecified: Secondary | ICD-10-CM | POA: Insufficient documentation

## 2021-12-09 LAB — CBC
HCT: 31.9 % — ABNORMAL LOW (ref 36.0–46.0)
Hemoglobin: 10.3 g/dL — ABNORMAL LOW (ref 12.0–15.0)
MCH: 29.5 pg (ref 26.0–34.0)
MCHC: 32.3 g/dL (ref 30.0–36.0)
MCV: 91.4 fL (ref 80.0–100.0)
Platelets: 208 10*3/uL (ref 150–400)
RBC: 3.49 MIL/uL — ABNORMAL LOW (ref 3.87–5.11)
RDW: 16.7 % — ABNORMAL HIGH (ref 11.5–15.5)
WBC: 4.1 10*3/uL (ref 4.0–10.5)
nRBC: 0 % (ref 0.0–0.2)

## 2021-12-09 LAB — RENAL FUNCTION PANEL
Albumin: 4.1 g/dL (ref 3.5–5.0)
Anion gap: 6 (ref 5–15)
BUN: 15 mg/dL (ref 8–23)
CO2: 24 mmol/L (ref 22–32)
Calcium: 9.3 mg/dL (ref 8.9–10.3)
Chloride: 107 mmol/L (ref 98–111)
Creatinine, Ser: 1.19 mg/dL — ABNORMAL HIGH (ref 0.44–1.00)
GFR, Estimated: 48 mL/min — ABNORMAL LOW (ref >60)
Glucose, Bld: 85 mg/dL (ref 70–99)
Phosphorus: 3.5 mg/dL (ref 2.5–4.6)
Potassium: 4 mmol/L (ref 3.5–5.1)
Sodium: 137 mmol/L (ref 135–145)

## 2021-12-09 MED ORDER — EPOETIN ALFA-EPBX 20000 UNIT/ML IJ SOLN: 20000.0000 [IU] | Freq: Once | INTRAMUSCULAR | Status: DC

## 2021-12-09 MED ORDER — EPOETIN ALFA-EPBX 10000 UNIT/ML IJ SOLN
20000.0000 [IU] | Freq: Once | INTRAMUSCULAR | Status: AC
  Administered 2021-12-09: 20000 [IU] via SUBCUTANEOUS
  Filled 2021-12-09: qty 2

## 2021-12-09 NOTE — Patient Instructions (Signed)
[facility]  Discharge Instructions: Thank you for choosing [facility] to provide your oncology and hematology care.  If you have a lab appointment with the Cancer Center, please come in thru the Main Entrance and check in at the main information desk.  Wear comfortable clothing and clothing appropriate for easy access to any Portacath or PICC line.   We strive to give you quality time with your provider. You may need to reschedule your appointment if you arrive late (15 or more minutes).  Arriving late affects you and other patients whose appointments are after yours.  Also, if you miss three or more appointments without notifying the office, you may be dismissed from the clinic at the provider's discretion.      For prescription refill requests, have your pharmacy contact our office and allow 72 hours for refills to be completed.    Today you received the following chemotherapy and/or immunotherapy agents Retacrit      To help prevent nausea and vomiting after your treatment, we encourage you to take your nausea medication as directed.  BELOW ARE SYMPTOMS THAT SHOULD BE REPORTED IMMEDIATELY: *FEVER GREATER THAN 100.4 F (38 C) OR HIGHER *CHILLS OR SWEATING *NAUSEA AND VOMITING THAT IS NOT CONTROLLED WITH YOUR NAUSEA MEDICATION *UNUSUAL SHORTNESS OF BREATH *UNUSUAL BRUISING OR BLEEDING *URINARY PROBLEMS (pain or burning when urinating, or frequent urination) *BOWEL PROBLEMS (unusual diarrhea, constipation, pain near the anus) TENDERNESS IN MOUTH AND THROAT WITH OR WITHOUT PRESENCE OF ULCERS (sore throat, sores in mouth, or a toothache) UNUSUAL RASH, SWELLING OR PAIN  UNUSUAL VAGINAL DISCHARGE OR ITCHING   Items with * indicate a potential emergency and should be followed up as soon as possible or go to the Emergency Department if any problems should occur.  Please show the CHEMOTHERAPY ALERT CARD or IMMUNOTHERAPY ALERT CARD at check-in to the Emergency  Department and triage nurse.  Should you have questions after your visit or need to cancel or reschedule your appointment, please contact  CANCER CENTER [phone]  and follow the prompts.  Office hours are 8:00 a.m. to 4:30 p.m. Monday - Friday. Please note that voicemails left after 4:00 p.m. may not be returned until the following business day.  We are closed weekends and major holidays. You have access to a nurse at all times for urgent questions. Please call the main number to the clinic [phone] and follow the prompts.  For any non-urgent questions, you may also contact your provider using MyChart. We now offer e-Visits for anyone 18 and older to request care online for non-urgent symptoms. For details visit [email or web address].   Also download the MyChart app! Go to the app store, search "MyChart", open the app, select [facility], and log in with your MyChart username and password.  Masks are optional in the cancer centers. If you would like for your care team to wear a mask while they are taking care of you, please let them know. For doctor visits, patients may have with them one support person who is at least 73 years old. At this time, visitors are not allowed in the infusion area.  

## 2021-12-09 NOTE — Progress Notes (Signed)
Patient presents today for Retacrit injection per providers order.  Hgb 10.3.  Patient has no new complaints at this time.  Stable during administration without incident; injection site WNL; see MAR for injection details.  Patient tolerated procedure well and without incident.  No questions or complaints noted at this time.  Discharge from clinic ambulatory in stable condition.  Alert and oriented X 3.  Follow up with [facility] as scheduled.

## 2022-01-05 NOTE — Progress Notes (Unsigned)
[place],  [id number]   CLINIC:  Medical Oncology/Hematology  PCP:  System, Provider Not In No address on file None   REASON FOR VISIT:  Follow-up for iron deficiency anemia and anemia of CKD   PRIOR THERAPY: None   CURRENT THERAPY: Intermittent IV iron infusions, Retacrit 20,000 units every 4 weeks  INTERVAL HISTORY:  Jennifer Li 71 y.o. female returns for routine follow-up of her anemia of CKD.  She was last seen by Tarri Abernethy PA-C on 10/15/2021.  Hospitalized in [place] in July 2023 for overnight observation due to abnormal labs and dehydration.  Otherwise, she has been doing well.  She continues to tolerate her Retacrit injections well.  She has not noticed any signs or symptoms of DVT or PE.  Blood pressure is at baseline.   She has not noticed any bleeding such as hematemesis, hematochezia, melena, or epistaxis.  She reports that her energy is somewhat better with her improved hemoglobin. She denies any pica, restless legs, chest pain, dyspnea on exertion, lightheadedness, or syncope.  She does have occasional headaches.  Regarding her mild thrombocytopenia (now resolved), she denies any symptoms of bruising, bleeding, or petechial rash.  She does not have any known history of liver disease, autoimmune disease, or connective tissue disorder. She does report that she takes several herbal supplements at home, but has stopped drinking dandelion tea and using red clover supplements.  She denies any quinine containing supplements.  She has 75% energy and  50% appetite. She endorses that she is maintaining a stable weight.   REVIEW OF SYSTEMS:    Review of Systems  Constitutional:  Positive for fatigue (Mild). Negative for appetite change, chills, diaphoresis, fever and unexpected weight change.  HENT:   Positive for tinnitus (aural fullness in left ear s/p ear infection) and trouble swallowing (Difficulty chewing).  Negative for lump/mass and nosebleeds.   Eyes:  Negative for eye problems.  Respiratory:  Negative for cough, hemoptysis and shortness of breath.   Cardiovascular:  Positive for palpitations. Negative for chest pain and leg swelling.  Gastrointestinal:  Negative for abdominal pain, blood in stool, constipation, diarrhea, nausea and vomiting.  Genitourinary:  Negative for hematuria.   Skin: Negative.   Neurological:  Positive for headaches (Occasional). Negative for dizziness and light-headedness.  Hematological:  Does not bruise/bleed easily.      PAST MEDICAL/SURGICAL HISTORY:  Past Medical History:  Diagnosis Date   Arthritis    Cataract    Chronic kidney disease    stage 3   Heart murmur    Past Surgical History:  Procedure Laterality Date   ABDOMINAL HYSTERECTOMY     CATARACT EXTRACTION, BILATERAL     gaglion cyst removal     MYOMECTOMY       SOCIAL HISTORY:  Social History   Socioeconomic History   Marital status: Widowed    Spouse name: Not on file   Number of children: Not on file   Years of education: Not on file   Highest education level: Not on file  Occupational History   Not on file  Tobacco Use   Smoking status: Former   Smokeless tobacco: Never  Substance and Sexual Activity   Alcohol use: Never   Drug use: Never   Sexual activity: Not Currently  Other Topics Concern   Not on file  Social History Narrative   Not on file   Social Determinants of Health   Financial Resource Strain:  Low Risk  (06/20/2020)   Overall Financial Resource Strain (CARDIA)    Difficulty of Paying Living Expenses: Not hard at all  Food Insecurity: Food Insecurity Present (06/20/2020)   Hunger Vital Sign    Worried About Running Out of Food in the Last Year: Sometimes true    Ran Out of Food in the Last Year: Sometimes true  Transportation Needs: Unmet Transportation Needs (06/20/2020)   PRAPARE - Hydrologist (Medical): Yes    Lack of  Transportation (Non-Medical): No  Physical Activity: Not on file  Stress: No Stress Concern Present (06/20/2020)   [place]    Feeling of Stress : Not at all  Social Connections: Moderately Integrated (06/20/2020)   Social Connection and Isolation Panel [NHANES]    Frequency of Communication with Friends and Family: More than three times a week    Frequency of Social Gatherings with Friends and Family: Once a week    Attends Religious Services: More than 4 times per year    Active Member of [employer] or Organizations: Yes    Attends Archivist Meetings: Never    Marital Status: Widowed  Intimate Partner Violence: Not At Risk (06/20/2020)   Humiliation, Afraid, Rape, and Kick questionnaire    Fear of Current or Ex-Partner: No    Emotionally Abused: No    Physically Abused: No    Sexually Abused: No    FAMILY HISTORY:  Family History  Problem Relation Age of Onset   Diabetes Sister    Diabetes Son     CURRENT MEDICATIONS:  Outpatient Encounter Medications as of 01/06/2022  Medication Sig   ascorbic acid (VITAMIN C) 1000 MG tablet Take by mouth.   ascorbic acid (VITAMIN C) 1000 MG tablet Vitamin C 1,000 mg tablet  Take 1 tablet every day by oral route.   aspirin EC 81 MG tablet Take 81 mg by mouth daily. Swallow whole.   cholecalciferol (VITAMIN D3) 25 MCG (1000 UNIT) tablet Take 1,000 Units by mouth daily.   Cholecalciferol 25 MCG (1000 UT) capsule 1 capsule 1 (one) time each day   cloNIDine (CATAPRES) 0.1 MG tablet Take by mouth.   losartan (COZAAR) 100 MG tablet losartan 100 mg tablet   metoprolol tartrate (LOPRESSOR) 100 MG tablet metoprolol tartrate 100 mg tablet   nitroGLYCERIN (NITROSTAT) 0.4 MG SL tablet nitroglycerin 0.4 mg sublingual tablet   spironolactone (ALDACTONE) 25 MG tablet spironolactone 25 mg tablet   triamcinolone ointment (KENALOG) 0.1 % Apply topically 2 (two) times daily.    valACYclovir (VALTREX) 500 MG tablet Take 1,000 mg by mouth 2 (two) times daily.   No facility-administered encounter medications on file as of 01/06/2022.    ALLERGIES:  Allergies  Allergen Reactions   Escitalopram    Gemfibrozil Hives and Other (See Comments)   Sulfa Antibiotics Hives   Sulfur    Trazodone      PHYSICAL EXAM:    ECOG PERFORMANCE STATUS: 0 - Asymptomatic  There were no vitals filed for this visit. There were no vitals filed for this visit. Physical Exam Constitutional:      Appearance: Normal appearance.  HENT:     Head: Normocephalic and atraumatic.     Mouth/Throat:     Mouth: Mucous membranes are moist.  Eyes:     Extraocular Movements: Extraocular movements intact.     Pupils: Pupils are equal, round, and reactive to light.  Cardiovascular:  Rate and Rhythm: Normal rate and regular rhythm.     Pulses: Normal pulses.     Heart sounds: Normal heart sounds.  Pulmonary:     Effort: Pulmonary effort is normal.     Breath sounds: Normal breath sounds.  Abdominal:     General: Bowel sounds are normal.     Palpations: Abdomen is soft.     Tenderness: There is no abdominal tenderness.  Musculoskeletal:        General: No swelling.     Right lower leg: No edema.     Left lower leg: No edema.  Lymphadenopathy:     Cervical: No cervical adenopathy.  Skin:    General: Skin is warm and dry.  Neurological:     General: No focal deficit present.     Mental Status: She is alert and oriented to person, place, and time.  Psychiatric:        Mood and Affect: Mood normal.        Behavior: Behavior normal.     LABORATORY DATA:  I have reviewed the labs as listed.  CBC    Component Value Date/Time   WBC 4.1 12/09/2021 0910   RBC 3.49 (L) 12/09/2021 0910   HGB 10.3 (L) 12/09/2021 0910   HCT 31.9 (L) 12/09/2021 0910   HCT 29.8 (L) 06/20/2020 1315   PLT 208 12/09/2021 0910   MCV 91.4 12/09/2021 0910   MCH 29.5 12/09/2021 0910   MCHC 32.3 12/09/2021  0910   RDW 16.7 (H) 12/09/2021 0910   LYMPHSABS 1.3 09/30/2021 1005   MONOABS 0.3 09/30/2021 1005   EOSABS 0.1 09/30/2021 1005   BASOSABS 0.0 09/30/2021 1005      Latest Ref Rng & Units 12/09/2021    9:10 AM 09/30/2021   10:05 AM 06/11/2021    2:43 PM  CMP  Glucose 70 - 99 mg/dL 85  102  101   BUN 8 - 23 mg/dL 15  27  29    Creatinine 0.44 - 1.00 mg/dL 1.19  1.37  1.36   Sodium 135 - 145 mmol/L 137  140  139   Potassium 3.5 - 5.1 mmol/L 4.0  4.4  4.7   Chloride 98 - 111 mmol/L 107  108  107   CO2 22 - 32 mmol/L 24  27  26    Calcium 8.9 - 10.3 mg/dL 9.3  9.5  9.4   Total Protein 6.5 - 8.1 g/dL  6.8    Total Bilirubin 0.3 - 1.2 mg/dL  0.8    Alkaline Phos 38 - 126 U/L  35    AST 15 - 41 U/L  24    ALT 0 - 44 U/L  23      DIAGNOSTIC IMAGING:  I have independently reviewed the relevant imaging and discussed with the patient.  ASSESSMENT & PLAN: 1.  Anemia of CKD and iron deficiency - EGD and colonoscopy in March 2021 by Dr. Phillip Heal showed mild gastritis, diverticulosis, internal and external hemorrhoids, 1 polyp which was noncancerous. - Capsule endoscopy in September 2021 - Bone marrow biopsy on 08/07/2020, normocellular marrow with trilineage hematopoiesis.  Mild polytypic plasmacytosis.  Chromosome analysis was normal.  SPEP was negative.  Free light chain ratio was normal with elevated kappa light chains consistent with CKD. - No prior history of blood transfusions.  No family history of sickle cell or thalassemia. - She received 1 g Venofer from 02/23/2021 - 03/09/2021 - She stopped iron tablet due to lack of improvement. -  She denies any bleeding per rectum or melena.     - She was started on Retacrit in December 2022, current dose is 20,000 units Q4 weeks - Most recent labs (01/06/2022): Hgb 10.7/MCV 92.4, baseline creatinine, elevated ferritin 428, with adequate iron saturation 21% - PLAN: Continue Retacrit frequency to 20,000 units every 4 weeks.   - Labs and RTC in 3 months.    2.  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 - Lowest platelets 105 on 09/16/2021 - Nutritional panel (09/30/2021): Normal B12, folate, copper - She previously was taking Red Clover and dandelion tea.  She denies any quinine containing supplements or tonic water.   - No known history of liver disease, autoimmune disorder, or connective tissue disorder  - She denies any bleeding, bruising, petechial rash    - Most recent CBC shows normal platelets at 179 - Differential diagnosis includes possible mild chronic ITP versus thrombocytopenic effect of herbal medication - PLAN: No indication for treatment at this time.  We will continue to monitor with CBCs as above.       3.  Stage IIIa/b CKD: - She follows with Dr. Christa See in [place], [place]   All questions were answered. The patient knows to call the clinic with any problems, questions or concerns.  Medical decision making: Moderate    Time spent on visit: I spent 20 minutes counseling the patient face to face. The total time spent in the appointment was 30 minutes and more than 50% was on counseling.   Harriett Rush, PA-C  01/06/2022 9:58 AM

## 2022-01-06 ENCOUNTER — Inpatient Hospital Stay: Payer: Medicare HMO | Admitting: Physician Assistant

## 2022-01-06 ENCOUNTER — Inpatient Hospital Stay (HOSPITAL_COMMUNITY): Payer: Medicare HMO

## 2022-01-06 ENCOUNTER — Inpatient Hospital Stay: Payer: Medicare HMO | Attending: Hematology

## 2022-01-06 ENCOUNTER — Inpatient Hospital Stay: Payer: Medicare HMO

## 2022-01-06 VITALS — BP 125/82 | HR 71 | Resp 16 | Wt 117.7 lb

## 2022-01-06 DIAGNOSIS — D649 Anemia, unspecified: Secondary | ICD-10-CM | POA: Diagnosis not present

## 2022-01-06 DIAGNOSIS — N183 Chronic kidney disease, stage 3 unspecified: Secondary | ICD-10-CM | POA: Insufficient documentation

## 2022-01-06 DIAGNOSIS — D631 Anemia in chronic kidney disease: Secondary | ICD-10-CM | POA: Insufficient documentation

## 2022-01-06 DIAGNOSIS — N1831 Chronic kidney disease, stage 3a: Secondary | ICD-10-CM | POA: Diagnosis not present

## 2022-01-06 DIAGNOSIS — D696 Thrombocytopenia, unspecified: Secondary | ICD-10-CM

## 2022-01-06 LAB — CBC WITH DIFFERENTIAL/PLATELET
Abs Immature Granulocytes: 0.01 10*3/uL (ref 0.00–0.07)
Basophils Absolute: 0 10*3/uL (ref 0.0–0.1)
Basophils Relative: 1 %
Eosinophils Absolute: 0.1 10*3/uL (ref 0.0–0.5)
Eosinophils Relative: 2 %
HCT: 33 % — ABNORMAL LOW (ref 36.0–46.0)
Hemoglobin: 10.7 g/dL — ABNORMAL LOW (ref 12.0–15.0)
Immature Granulocytes: 0 %
Lymphocytes Relative: 38 %
Lymphs Abs: 1.6 10*3/uL (ref 0.7–4.0)
MCH: 30 pg (ref 26.0–34.0)
MCHC: 32.4 g/dL (ref 30.0–36.0)
MCV: 92.4 fL (ref 80.0–100.0)
Monocytes Absolute: 0.2 10*3/uL (ref 0.1–1.0)
Monocytes Relative: 6 %
Neutro Abs: 2.2 10*3/uL (ref 1.7–7.7)
Neutrophils Relative %: 53 %
Platelets: 179 10*3/uL (ref 150–400)
RBC: 3.57 MIL/uL — ABNORMAL LOW (ref 3.87–5.11)
RDW: 14.9 % (ref 11.5–15.5)
WBC: 4.2 10*3/uL (ref 4.0–10.5)
nRBC: 0 % (ref 0.0–0.2)

## 2022-01-06 LAB — COMPREHENSIVE METABOLIC PANEL
ALT: 15 U/L (ref 0–44)
AST: 19 U/L (ref 15–41)
Albumin: 4.2 g/dL (ref 3.5–5.0)
Alkaline Phosphatase: 35 U/L — ABNORMAL LOW (ref 38–126)
Anion gap: 8 (ref 5–15)
BUN: 27 mg/dL — ABNORMAL HIGH (ref 8–23)
CO2: 25 mmol/L (ref 22–32)
Calcium: 9.6 mg/dL (ref 8.9–10.3)
Chloride: 109 mmol/L (ref 98–111)
Creatinine, Ser: 1.39 mg/dL — ABNORMAL HIGH (ref 0.44–1.00)
GFR, Estimated: 40 mL/min — ABNORMAL LOW (ref >60)
Glucose, Bld: 96 mg/dL (ref 70–99)
Potassium: 4.2 mmol/L (ref 3.5–5.1)
Sodium: 142 mmol/L (ref 135–145)
Total Bilirubin: 0.8 mg/dL (ref 0.3–1.2)
Total Protein: 7.1 g/dL (ref 6.5–8.1)

## 2022-01-06 LAB — IRON AND TIBC
Iron: 59 ug/dL (ref 28–170)
Saturation Ratios: 21 % (ref 10.4–31.8)
TIBC: 284 ug/dL (ref 250–450)
UIBC: 225 ug/dL

## 2022-01-06 LAB — FERRITIN: Ferritin: 428 ng/mL — ABNORMAL HIGH (ref 11–307)

## 2022-01-06 MED ORDER — EPOETIN ALFA-EPBX 20000 UNIT/ML IJ SOLN
20000.0000 [IU] | Freq: Once | INTRAMUSCULAR | Status: AC
  Administered 2022-01-06: 20000 [IU] via SUBCUTANEOUS
  Filled 2022-01-06: qty 1

## 2022-01-06 NOTE — Progress Notes (Signed)
Jennifer Li presents today for Retacrit injection per the provider's orders.  Stable during administration without incident; injection site WNL; see MAR for injection details.  Patient tolerated procedure well and without incident.  No questions or complaints noted at this time.

## 2022-01-06 NOTE — Patient Instructions (Signed)
[facility] Discharge Instructions  You were seen today by Rojelio Brenner PA-C for your anemia.  Your blood counts have improved.  We will continue your Retacrit injections 20,000 units to every 4 weeks.  We will continue to have your blood checked on the same day as your Retacrit injections.  FOLLOW-UP APPOINTMENT: Office visit in 3 months   - - - - - - - - - - - - - - - - - -   The website for  "[facility] About Herbs" is an excellent resource on various herbal medications.  Although it does not include every herbal supplement available, it does include 250+ common supplements.  [email or web address]   - - - - - - - - - - - - - - - - - -   Thank you for choosing [facility] at [facility] to provide your oncology and hematology care.  To afford each patient quality time with our provider, please arrive at least 15 minutes before your scheduled appointment time.   If you have a lab appointment with the Cancer Center please come in thru the Main Entrance and check in at the main information desk.  You need to re-schedule your appointment should you arrive 10 or more minutes late.  We strive to give you quality time with our providers, and arriving late affects you and other patients whose appointments are after yours.  Also, if you no show three or more times for appointments you may be dismissed from the clinic at the providers discretion.     Again, thank you for choosing [facility].  Our hope is that these requests will decrease the amount of time that you wait before being seen by our physicians.       _____________________________________________________________  Should you have questions after your visit to [facility], please contact our office at [phone] and follow the prompts.   Our office hours are 8:00 a.m. and 4:30 p.m. Monday - Friday.  Please note that voicemails left after 4:00 p.m. may not be returned until the following business day.  We are closed weekends and major holidays.  You do have access to a nurse 24-7, just call the main number to the clinic [phone] and do not press any options, hold on the line and a nurse will answer the phone.    For prescription refill requests, have your pharmacy contact our office and allow 72 hours.    Due to Covid, you will need to wear a mask upon entering the hospital. If you do not have a mask, a mask will be given to you at the Main Entrance upon arrival. For doctor visits, patients may have 1 support person age 75 or older with them. For treatment visits, patients can not have anyone with them due to social distancing guidelines and our immunocompromised population.

## 2022-01-06 NOTE — Patient Instructions (Signed)
[facility]  Discharge Instructions: Thank you for choosing [facility] to provide your oncology and hematology care.  If you have a lab appointment with the Cancer Center, please come in thru the Main Entrance and check in at the main information desk.  Wear comfortable clothing and clothing appropriate for easy access to any Portacath or PICC line.   We strive to give you quality time with your provider. You may need to reschedule your appointment if you arrive late (15 or more minutes).  Arriving late affects you and other patients whose appointments are after yours.  Also, if you miss three or more appointments without notifying the office, you may be dismissed from the clinic at the provider's discretion.      For prescription refill requests, have your pharmacy contact our office and allow 72 hours for refills to be completed.    Today you received the following chemotherapy and/or immunotherapy agents retacrit.        To help prevent nausea and vomiting after your treatment, we encourage you to take your nausea medication as directed.  BELOW ARE SYMPTOMS THAT SHOULD BE REPORTED IMMEDIATELY: *FEVER GREATER THAN 100.4 F (38 C) OR HIGHER *CHILLS OR SWEATING *NAUSEA AND VOMITING THAT IS NOT CONTROLLED WITH YOUR NAUSEA MEDICATION *UNUSUAL SHORTNESS OF BREATH *UNUSUAL BRUISING OR BLEEDING *URINARY PROBLEMS (pain or burning when urinating, or frequent urination) *BOWEL PROBLEMS (unusual diarrhea, constipation, pain near the anus) TENDERNESS IN MOUTH AND THROAT WITH OR WITHOUT PRESENCE OF ULCERS (sore throat, sores in mouth, or a toothache) UNUSUAL RASH, SWELLING OR PAIN  UNUSUAL VAGINAL DISCHARGE OR ITCHING   Items with * indicate a potential emergency and should be followed up as soon as possible or go to the Emergency Department if any problems should occur.  Please show the CHEMOTHERAPY ALERT CARD or IMMUNOTHERAPY ALERT CARD at check-in to the  Emergency Department and triage nurse.  Should you have questions after your visit or need to cancel or reschedule your appointment, please contact [facility] [phone]  and follow the prompts.  Office hours are 8:00 a.m. to 4:30 p.m. Monday - Friday. Please note that voicemails left after 4:00 p.m. may not be returned until the following business day.  We are closed weekends and major holidays. You have access to a nurse at all times for urgent questions. Please call the main number to the clinic [phone] and follow the prompts.  For any non-urgent questions, you may also contact your provider using MyChart. We now offer e-Visits for anyone 18 and older to request care online for non-urgent symptoms. For details visit [email or web address].   Also download the MyChart app! Go to the app store, search "MyChart", open the app, select [facility], and log in with your MyChart username and password.  Masks are optional in the cancer centers. If you would like for your care team to wear a mask while they are taking care of you, please let them know. For doctor visits, patients may have with them one support person who is at least 73 years old. At this time, visitors are not allowed in the infusion area.  

## 2022-01-13 ENCOUNTER — Inpatient Hospital Stay (HOSPITAL_COMMUNITY): Payer: Medicare HMO | Admitting: Physician Assistant

## 2022-02-03 ENCOUNTER — Inpatient Hospital Stay: Payer: Medicare HMO

## 2022-02-03 ENCOUNTER — Inpatient Hospital Stay: Payer: Medicare HMO | Attending: Hematology

## 2022-02-03 DIAGNOSIS — N1831 Chronic kidney disease, stage 3a: Secondary | ICD-10-CM

## 2022-02-03 DIAGNOSIS — N183 Chronic kidney disease, stage 3 unspecified: Secondary | ICD-10-CM | POA: Insufficient documentation

## 2022-02-03 DIAGNOSIS — D649 Anemia, unspecified: Secondary | ICD-10-CM

## 2022-02-03 DIAGNOSIS — D631 Anemia in chronic kidney disease: Secondary | ICD-10-CM | POA: Diagnosis present

## 2022-02-03 LAB — CBC
HCT: 34.4 % — ABNORMAL LOW (ref 36.0–46.0)
Hemoglobin: 11.1 g/dL — ABNORMAL LOW (ref 12.0–15.0)
MCH: 29.5 pg (ref 26.0–34.0)
MCHC: 32.3 g/dL (ref 30.0–36.0)
MCV: 91.5 fL (ref 80.0–100.0)
Platelets: 159 10*3/uL (ref 150–400)
RBC: 3.76 MIL/uL — ABNORMAL LOW (ref 3.87–5.11)
RDW: 15.2 % (ref 11.5–15.5)
WBC: 4.7 10*3/uL (ref 4.0–10.5)
nRBC: 0 % (ref 0.0–0.2)

## 2022-02-03 NOTE — Progress Notes (Signed)
Hemoglobin 11.1 today. Will hold retacrit per treatment parameters .

## 2022-03-01 ENCOUNTER — Encounter: Payer: Self-pay | Admitting: Hematology

## 2022-03-01 DIAGNOSIS — N1831 Chronic kidney disease, stage 3a: Secondary | ICD-10-CM | POA: Insufficient documentation

## 2022-03-01 NOTE — Progress Notes (Signed)
Insurance prefers Procrit.  Orders updated to reflect above at Procrit 30,000 units SubQ q 4 weeks.  Henreitta Leber, PharmD 03/01/22 @ 1200

## 2022-03-03 ENCOUNTER — Ambulatory Visit: Payer: Medicare HMO

## 2022-03-03 ENCOUNTER — Other Ambulatory Visit: Payer: Medicare HMO

## 2022-03-03 ENCOUNTER — Inpatient Hospital Stay: Payer: Medicare HMO

## 2022-03-04 ENCOUNTER — Inpatient Hospital Stay: Payer: Medicare HMO

## 2022-03-04 ENCOUNTER — Inpatient Hospital Stay: Payer: Medicare HMO | Attending: Hematology

## 2022-03-04 DIAGNOSIS — D649 Anemia, unspecified: Secondary | ICD-10-CM

## 2022-03-04 DIAGNOSIS — N1831 Chronic kidney disease, stage 3a: Secondary | ICD-10-CM | POA: Diagnosis present

## 2022-03-04 DIAGNOSIS — D631 Anemia in chronic kidney disease: Secondary | ICD-10-CM | POA: Diagnosis present

## 2022-03-04 LAB — CBC
HCT: 34 % — ABNORMAL LOW (ref 36.0–46.0)
Hemoglobin: 11.1 g/dL — ABNORMAL LOW (ref 12.0–15.0)
MCH: 29.5 pg (ref 26.0–34.0)
MCHC: 32.6 g/dL (ref 30.0–36.0)
MCV: 90.4 fL (ref 80.0–100.0)
Platelets: 137 10*3/uL — ABNORMAL LOW (ref 150–400)
RBC: 3.76 MIL/uL — ABNORMAL LOW (ref 3.87–5.11)
RDW: 14.6 % (ref 11.5–15.5)
WBC: 4.5 10*3/uL (ref 4.0–10.5)
nRBC: 0 % (ref 0.0–0.2)

## 2022-03-04 NOTE — Progress Notes (Signed)
No Retacrit today per order parameters for a Hgb of 11.1.

## 2022-03-31 ENCOUNTER — Inpatient Hospital Stay: Payer: Medicare HMO | Admitting: Physician Assistant

## 2022-03-31 ENCOUNTER — Inpatient Hospital Stay: Payer: Medicare HMO

## 2022-06-04 ENCOUNTER — Inpatient Hospital Stay: Payer: Medicare HMO | Attending: Hematology | Admitting: Nurse Practitioner

## 2022-06-04 ENCOUNTER — Inpatient Hospital Stay: Payer: Medicare HMO

## 2022-06-04 ENCOUNTER — Other Ambulatory Visit: Payer: Self-pay

## 2022-06-04 VITALS — BP 140/77 | HR 58 | Temp 97.7°F | Resp 16 | Wt 121.9 lb

## 2022-06-04 DIAGNOSIS — N1831 Chronic kidney disease, stage 3a: Secondary | ICD-10-CM

## 2022-06-04 DIAGNOSIS — D649 Anemia, unspecified: Secondary | ICD-10-CM

## 2022-06-04 DIAGNOSIS — D631 Anemia in chronic kidney disease: Secondary | ICD-10-CM | POA: Diagnosis present

## 2022-06-04 DIAGNOSIS — Z79899 Other long term (current) drug therapy: Secondary | ICD-10-CM

## 2022-06-04 LAB — COMPREHENSIVE METABOLIC PANEL
ALT: 18 U/L (ref 0–44)
AST: 21 U/L (ref 15–41)
Albumin: 4.4 g/dL (ref 3.5–5.0)
Alkaline Phosphatase: 41 U/L (ref 38–126)
Anion gap: 7 (ref 5–15)
BUN: 45 mg/dL — ABNORMAL HIGH (ref 8–23)
CO2: 26 mmol/L (ref 22–32)
Calcium: 10 mg/dL (ref 8.9–10.3)
Chloride: 107 mmol/L (ref 98–111)
Creatinine, Ser: 1.53 mg/dL — ABNORMAL HIGH (ref 0.44–1.00)
GFR, Estimated: 36 mL/min — ABNORMAL LOW (ref >60)
Glucose, Bld: 93 mg/dL (ref 70–99)
Potassium: 4.3 mmol/L (ref 3.5–5.1)
Sodium: 140 mmol/L (ref 135–145)
Total Bilirubin: 0.9 mg/dL (ref 0.3–1.2)
Total Protein: 7.5 g/dL (ref 6.5–8.1)

## 2022-06-04 LAB — CBC WITH DIFFERENTIAL/PLATELET
Abs Immature Granulocytes: 0.01 10*3/uL (ref 0.00–0.07)
Basophils Absolute: 0 10*3/uL (ref 0.0–0.1)
Basophils Relative: 0 %
Eosinophils Absolute: 0.1 10*3/uL (ref 0.0–0.5)
Eosinophils Relative: 2 %
HCT: 33.7 % — ABNORMAL LOW (ref 36.0–46.0)
Hemoglobin: 10.7 g/dL — ABNORMAL LOW (ref 12.0–15.0)
Immature Granulocytes: 0 %
Lymphocytes Relative: 33 %
Lymphs Abs: 1.6 10*3/uL (ref 0.7–4.0)
MCH: 29.2 pg (ref 26.0–34.0)
MCHC: 31.8 g/dL (ref 30.0–36.0)
MCV: 91.8 fL (ref 80.0–100.0)
Monocytes Absolute: 0.3 10*3/uL (ref 0.1–1.0)
Monocytes Relative: 6 %
Neutro Abs: 2.8 10*3/uL (ref 1.7–7.7)
Neutrophils Relative %: 59 %
Platelets: 174 10*3/uL (ref 150–400)
RBC: 3.67 MIL/uL — ABNORMAL LOW (ref 3.87–5.11)
RDW: 14.6 % (ref 11.5–15.5)
WBC: 4.8 10*3/uL (ref 4.0–10.5)
nRBC: 0 % (ref 0.0–0.2)

## 2022-06-04 LAB — IRON AND TIBC
Iron: 61 ug/dL (ref 28–170)
Saturation Ratios: 19 % (ref 10.4–31.8)
TIBC: 318 ug/dL (ref 250–450)
UIBC: 257 ug/dL

## 2022-06-04 LAB — FERRITIN: Ferritin: 433 ng/mL — ABNORMAL HIGH (ref 11–307)

## 2022-06-04 MED ORDER — EPOETIN ALFA 20000 UNIT/ML IJ SOLN
20000.0000 [IU] | Freq: Once | INTRAMUSCULAR | Status: AC
  Administered 2022-06-04: 20000 [IU] via SUBCUTANEOUS
  Filled 2022-06-04: qty 1

## 2022-06-04 MED ORDER — EPOETIN ALFA 10000 UNIT/ML IJ SOLN
10000.0000 [IU] | Freq: Once | INTRAMUSCULAR | Status: AC
  Administered 2022-06-04: 10000 [IU] via SUBCUTANEOUS
  Filled 2022-06-04: qty 1

## 2022-06-04 NOTE — Progress Notes (Signed)
[facility] [address], [place] [address]   CLINIC:  Medical Oncology/Hematology  PCP:  System, Provider Not In No address on file None  Virtual Visit Progress Note  I connected with Jennifer Li on 06/04/22 at  9:30 AM EST by video enabled telemedicine visit and verified that I am speaking with the correct person using two identifiers.   I discussed the limitations, risks, security and privacy concerns of performing an evaluation and management service by telemedicine and the availability of in-person appointments. I also discussed with the patient that there may be a patient responsible charge related to this service. The patient expressed understanding and agreed to proceed.   Other persons participating in the visit and their role in the encounter: RN, NP, Patient   Patient's location: [facility]  Provider's location: home  REASON FOR VISIT:  Follow-up for iron deficiency anemia and anemia of CKD   PRIOR THERAPY: None   CURRENT THERAPY: Intermittent IV iron infusions, Retacrit 20,000 units every 4 weeks  INTERVAL HISTORY:  Jennifer Li 72 y.o. female returns for routine follow-up of her anemia of CKD.  She continues to tolerate her Retacrit injections well.  She has not noticed any signs or symptoms of DVT or PE.  Blood pressure is at baseline.   She has not noticed any bleeding such as hematemesis, hematochezia, melena, or epistaxis.  She has been in [place] for several months for the passing of her aunt. She's also suffered passing of 6 other family members. She continues to grieve their loss.   REVIEW OF SYSTEMS:    Review of Systems  Constitutional:  Negative for appetite change, chills and unexpected weight change.  HENT:   Negative for lump/mass, mouth sores, nosebleeds, sore throat and trouble swallowing.   Respiratory:  Negative for cough, hemoptysis and shortness of breath.   Cardiovascular:  Negative for chest pain and palpitations.   Gastrointestinal:  Negative for abdominal distention, abdominal pain, blood in stool, constipation, diarrhea, nausea and vomiting.  Endocrine: Negative for hot flashes.  Genitourinary:  Negative for bladder incontinence, difficulty urinating, dysuria and hematuria.   Musculoskeletal:  Negative for arthralgias and myalgias.  Skin:  Negative for rash and wound.  Neurological:  Negative for dizziness, extremity weakness, headaches and light-headedness.  Hematological:  Negative for adenopathy. Does not bruise/bleed easily.  Psychiatric/Behavioral:  Negative for depression and sleep disturbance.   All other systems reviewed and are negative.   PAST MEDICAL/SURGICAL HISTORY:  Past Medical History:  Diagnosis Date   Arthritis    Cataract    Chronic kidney disease    stage 3   Heart murmur    Past Surgical History:  Procedure Laterality Date   ABDOMINAL HYSTERECTOMY     CATARACT EXTRACTION, BILATERAL     gaglion cyst removal     MYOMECTOMY     SOCIAL HISTORY:  Social History   Socioeconomic History   Marital status: Widowed    Spouse name: Not on file   Number of children: Not on file   Years of education: Not on file   Highest education level: Not on file  Occupational History   Not on file  Tobacco Use   Smoking status: Former   Smokeless tobacco: Never  Substance and Sexual Activity   Alcohol use: Never   Drug use: Never   Sexual activity: Not Currently  Other Topics Concern   Not on file  Social History Narrative   Not on file  Social Determinants of Health   Financial Resource Strain: Low Risk  (06/20/2020)   Overall Financial Resource Strain (CARDIA)    Difficulty of Paying Living Expenses: Not hard at all  Food Insecurity: Food Insecurity Present (06/20/2020)   Hunger Vital Sign    Worried About Running Out of Food in the Last Year: Sometimes true    Ran Out of Food in the Last Year: Sometimes true  Transportation Needs: Unmet Transportation Needs  (06/20/2020)   PRAPARE - Hydrologist (Medical): Yes    Lack of Transportation (Non-Medical): No  Physical Activity: Not on file  Stress: No Stress Concern Present (06/20/2020)   [place]    Feeling of Stress : Not at all  Social Connections: Moderately Integrated (06/20/2020)   Social Connection and Isolation Panel [NHANES]    Frequency of Communication with Friends and Family: More than three times a week    Frequency of Social Gatherings with Friends and Family: Once a week    Attends Religious Services: More than 4 times per year    Active Member of [employer] or Organizations: Yes    Attends Archivist Meetings: Never    Marital Status: Widowed  Intimate Partner Violence: Not At Risk (06/20/2020)   Humiliation, Afraid, Rape, and Kick questionnaire    Fear of Current or Ex-Partner: No    Emotionally Abused: No    Physically Abused: No    Sexually Abused: No    FAMILY HISTORY:  Family History  Problem Relation Age of Onset   Diabetes Sister    Diabetes Son     CURRENT MEDICATIONS:  Outpatient Encounter Medications as of 06/04/2022  Medication Sig   amLODipine (NORVASC) 10 MG tablet Take 10 mg by mouth daily.   ascorbic acid (VITAMIN C) 1000 MG tablet Vitamin C 1,000 mg tablet  Take 1 tablet every day by oral route.   cholecalciferol (VITAMIN D3) 25 MCG (1000 UNIT) tablet Take 1,000 Units by mouth daily.   cyanocobalamin (VITAMIN B12) 1000 MCG tablet Take 1,000 mcg by mouth daily.   losartan (COZAAR) 100 MG tablet losartan 100 mg tablet   Magnesium 250 MG TABS Take by mouth.   Metoprolol Tartrate 37.5 MG TABS Take 37.5 mg by mouth 2 (two) times daily.   Misc Natural Products (WH[place] PO) Take by mouth.   niacin (VITAMIN B3) 500 MG ER tablet TAKE 1 TABLET BY MOUTH EVERY DAY AT BEDTIME FOR 90 DAYS   spironolactone (ALDACTONE) 25 MG tablet spironolactone 25 mg tablet    triamcinolone ointment (KENALOG) 0.1 % Apply topically 2 (two) times daily.   valACYclovir (VALTREX) 500 MG tablet Take 1,000 mg by mouth as needed.   nitroGLYCERIN (NITROSTAT) 0.4 MG SL tablet nitroglycerin 0.4 mg sublingual tablet (Patient not taking: Reported on 06/04/2022)   No facility-administered encounter medications on file as of 06/04/2022.    ALLERGIES:  Allergies  Allergen Reactions   Escitalopram    Gemfibrozil Hives and Other (See Comments)   Sulfa Antibiotics Hives   Sulfur    Trazodone    Valsartan Other (See Comments)    Patient says that she does not have allergy to this    PHYSICAL EXAM:    ECOG PERFORMANCE STATUS: 0 - Asymptomatic  Vitals:   06/04/22 0846  BP: (!) 140/77  Pulse: (!) 58  Resp: 16  Temp: 97.7 F (36.5 C)  SpO2: 100%   Filed Weights  06/04/22 0846  Weight: 121 lb 14.6 oz (55.3 kg)   Physical Exam Constitutional:      General: She is not in acute distress. HENT:     Head: Normocephalic.  Pulmonary:     Effort: No respiratory distress.  Neurological:     Mental Status: She is alert and oriented to person, place, and time.  Psychiatric:        Mood and Affect: Mood normal.        Behavior: Behavior normal.    LABORATORY DATA:  I have reviewed the labs as listed.  CBC    Component Value Date/Time   WBC 4.8 06/04/2022 0820   RBC 3.67 (L) 06/04/2022 0820   HGB 10.7 (L) 06/04/2022 0820   HCT 33.7 (L) 06/04/2022 0820   HCT 29.8 (L) 06/20/2020 1315   PLT 174 06/04/2022 0820   MCV 91.8 06/04/2022 0820   MCH 29.2 06/04/2022 0820   MCHC 31.8 06/04/2022 0820   RDW 14.6 06/04/2022 0820   LYMPHSABS 1.6 06/04/2022 0820   MONOABS 0.3 06/04/2022 0820   EOSABS 0.1 06/04/2022 0820   BASOSABS 0.0 06/04/2022 0820      Latest Ref Rng & Units 06/04/2022    8:20 AM 01/06/2022    8:34 AM 12/09/2021    9:10 AM  CMP  Glucose 70 - 99 mg/dL 93  96  85   BUN 8 - 23 mg/dL 45  27  15   Creatinine 0.44 - 1.00 mg/dL 1.53  1.39  1.19   Sodium 135 -  145 mmol/L 140  142  137   Potassium 3.5 - 5.1 mmol/L 4.3  4.2  4.0   Chloride 98 - 111 mmol/L 107  109  107   CO2 22 - 32 mmol/L 26  25  24    Calcium 8.9 - 10.3 mg/dL 10.0  9.6  9.3   Total Protein 6.5 - 8.1 g/dL 7.5  7.1    Total Bilirubin 0.3 - 1.2 mg/dL 0.9  0.8    Alkaline Phos 38 - 126 U/L 41  35    AST 15 - 41 U/L 21  19    ALT 0 - 44 U/L 18  15      DIAGNOSTIC IMAGING:  I have independently reviewed the relevant imaging and discussed with the patient.  ASSESSMENT & PLAN: 1.  Anemia of CKD and iron deficiency - EGD and colonoscopy in March 2021 by Dr. Phillip Heal showed mild gastritis, diverticulosis, internal and external hemorrhoids, 1 polyp which was noncancerous. - Capsule endoscopy in September 2021 - Bone marrow biopsy on 08/07/2020, normocellular marrow with trilineage hematopoiesis.  Mild polytypic plasmacytosis.  Chromosome analysis was normal.  SPEP was negative.  Free light chain ratio was normal with elevated kappa light chains consistent with CKD. - No prior history of blood transfusions.  No family history of sickle cell or thalassemia. - She received 1 g Venofer from 02/23/2021 - 03/09/2021 - She stopped iron tablet due to lack of improvement. - She denies any bleeding per rectum or melena.     - She was started on Retacrit in December 2022, current dose is 20,000 units Q4 weeks - Labs are stable. Hmg 10.7. She has not received retacrit since 12/2021. Did not receive in [place]. Wishes to take retacrit today. Plan for retacrit for hemoglobin < 11. Continue current dosing.  - PLAN: Continue Retacrit frequency to 20,000 units every 4 weeks for hemoglobin < 11.  - Labs and RTC in 3 months.  2.  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 - Lowest platelets 105 on 09/16/2021 - Nutritional panel (09/30/2021): Normal B12, folate,  copper - She previously was taking Red Clover and dandelion tea.  She denies any quinine containing supplements or tonic water.   - No known history of liver disease, autoimmune disorder, or connective tissue disorder  - She denies any bleeding, bruising, petechial rash    - Most recent CBC shows normal platelets at 179 - Differential diagnosis includes possible mild chronic ITP versus thrombocytopenic effect of herbal medication - PLAN: No indication for treatment at this time.  We will continue to monitor with CBCs as above.       3.  Stage IIIa/b CKD: - She follows with Dr. Ramiro Harvest in [place], [place]  All questions were answered. The patient knows to call the clinic with any problems, questions or concerns.  Medical decision making: Moderate     Return to clinic  Retacrit today 1 mo- cbc, +/- retacrit 2 mo- cbc, +/- retacrit 3 mo- lab (cbc, cmp, ferritin, iron studies), Rebekah, +/- retacrit- la   I discussed the assessment and treatment plan with the patient. The patient was provided an opportunity to ask questions and all were answered. The patient agreed with the plan and demonstrated an understanding of the instructions.   The patient was advised to call back or seek an in-person evaluation if the symptoms worsen or if the condition fails to improve as anticipated.   I spent 20 minutes face-to-face video visit time dedicated to the care of this patient on the date of this encounter to include pre-visit review of heme/onc notes, labs, imaging, face-to-face time with the patient, and post visit ordering of testing/documentation.   Consuello Masse, DNP, AGNP-C [facility] [phone]

## 2022-06-04 NOTE — Progress Notes (Signed)
Jennifer Li presents today for injection per the provider's orders.  Epogen 30,000 units  administration without incident; injection site WNL; see MAR for injection details.  Patient tolerated procedure well and without incident.  No questions or complaints noted at this time. Treatment given today per MD orders. No complaints at this time. Discharged from clinic ambulatory in stable condition. Alert and oriented x 3. F/U with [facility] as scheduled.

## 2022-06-04 NOTE — Patient Instructions (Addendum)
[facility]  Discharge Instructions: Thank you for choosing [facility] to provide your oncology and hematology care.  If you have a lab appointment with the [facility], please come in thru the Main Entrance and check in at the main information desk.  Wear comfortable clothing and clothing appropriate for easy access to any Portacath or PICC line.   We strive to give you quality time with your provider. You may need to reschedule your appointment if you arrive late (15 or more minutes).  Arriving late affects you and other patients whose appointments are after yours.  Also, if you miss three or more appointments without notifying the office, you may be dismissed from the clinic at the provider's discretion.      For prescription refill requests, have your pharmacy contact our office and allow 72 hours for refills to be completed.    Today you received the following chemotherapy and/or immunotherapy agents Epogen 30,000 units.      To help prevent nausea and vomiting after your treatment, we encourage you to take your nausea medication as directed.  BELOW ARE SYMPTOMS THAT SHOULD BE REPORTED IMMEDIATELY: *FEVER GREATER THAN 100.4 F (38 C) OR HIGHER *CHILLS OR SWEATING *NAUSEA AND VOMITING THAT IS NOT CONTROLLED WITH YOUR NAUSEA MEDICATION *UNUSUAL SHORTNESS OF BREATH *UNUSUAL BRUISING OR BLEEDING *URINARY PROBLEMS (pain or burning when urinating, or frequent urination) *BOWEL PROBLEMS (unusual diarrhea, constipation, pain near the anus) TENDERNESS IN MOUTH AND THROAT WITH OR WITHOUT PRESENCE OF ULCERS (sore throat, sores in mouth, or a toothache) UNUSUAL RASH, SWELLING OR PAIN  UNUSUAL VAGINAL DISCHARGE OR ITCHING   Items with * indicate a potential emergency and should be followed up as soon as possible or go to the Emergency Department if any problems should occur.  Please show the CHEMOTHERAPY ALERT CARD or IMMUNOTHERAPY ALERT CARD at check-in to  the Emergency Department and triage nurse.  Should you have questions after your visit or need to cancel or reschedule your appointment, please contact [facility] [phone]  and follow the prompts.  Office hours are 8:00 a.m. to 4:30 p.m. Monday - Friday. Please note that voicemails left after 4:00 p.m. may not be returned until the following business day.  We are closed weekends and major holidays. You have access to a nurse at all times for urgent questions. Please call the main number to the clinic [phone] and follow the prompts.  For any non-urgent questions, you may also contact your provider using MyChart. We now offer e-Visits for anyone 80 and older to request care online for non-urgent symptoms. For details visit [email or web address].   Also download the MyChart app! Go to the app store, search "MyChart", open the app, select , and log in with your MyChart username and password.

## 2022-06-07 ENCOUNTER — Encounter: Payer: Self-pay | Admitting: *Deleted

## 2022-06-07 NOTE — Progress Notes (Signed)
Requested labs faxed to [facility] per request of Dr. Christa See to [phone].

## 2022-07-02 ENCOUNTER — Inpatient Hospital Stay: Payer: Medicare HMO | Attending: Hematology

## 2022-07-02 ENCOUNTER — Inpatient Hospital Stay: Payer: Medicare HMO

## 2022-07-02 DIAGNOSIS — N1831 Chronic kidney disease, stage 3a: Secondary | ICD-10-CM | POA: Diagnosis not present

## 2022-07-02 DIAGNOSIS — D631 Anemia in chronic kidney disease: Secondary | ICD-10-CM | POA: Insufficient documentation

## 2022-07-02 DIAGNOSIS — D649 Anemia, unspecified: Secondary | ICD-10-CM

## 2022-07-02 LAB — CBC
HCT: 34.7 % — ABNORMAL LOW (ref 36.0–46.0)
Hemoglobin: 11.3 g/dL — ABNORMAL LOW (ref 12.0–15.0)
MCH: 29.4 pg (ref 26.0–34.0)
MCHC: 32.6 g/dL (ref 30.0–36.0)
MCV: 90.1 fL (ref 80.0–100.0)
Platelets: 143 10*3/uL — ABNORMAL LOW (ref 150–400)
RBC: 3.85 MIL/uL — ABNORMAL LOW (ref 3.87–5.11)
RDW: 14.6 % (ref 11.5–15.5)
WBC: 3.6 10*3/uL — ABNORMAL LOW (ref 4.0–10.5)
nRBC: 0 % (ref 0.0–0.2)

## 2022-07-02 NOTE — Progress Notes (Signed)
Hemoglobin today is 11.3.  Hold Retacrit injection today per provider orders.  Patient left ambulatory in stable condition.  Patient will RTC as scheduled.

## 2022-07-02 NOTE — Patient Instructions (Signed)
[facility]  Discharge Instructions: Thank you for choosing [facility] to provide your oncology and hematology care.  If you have a lab appointment with the Cancer Center, please come in thru the Main Entrance and check in at the main information desk.  Wear comfortable clothing and clothing appropriate for easy access to any Portacath or PICC line.   We strive to give you quality time with your provider. You may need to reschedule your appointment if you arrive late (15 or more minutes).  Arriving late affects you and other patients whose appointments are after yours.  Also, if you miss three or more appointments without notifying the office, you may be dismissed from the clinic at the provider's discretion.      For prescription refill requests, have your pharmacy contact our office and allow 72 hours for refills to be completed.    To help prevent nausea and vomiting after your treatment, we encourage you to take your nausea medication as directed.  BELOW ARE SYMPTOMS THAT SHOULD BE REPORTED IMMEDIATELY: *FEVER GREATER THAN 100.4 F (38 C) OR HIGHER *CHILLS OR SWEATING *NAUSEA AND VOMITING THAT IS NOT CONTROLLED WITH YOUR NAUSEA MEDICATION *UNUSUAL SHORTNESS OF BREATH *UNUSUAL BRUISING OR BLEEDING *URINARY PROBLEMS (pain or burning when urinating, or frequent urination) *BOWEL PROBLEMS (unusual diarrhea, constipation, pain near the anus) TENDERNESS IN MOUTH AND THROAT WITH OR WITHOUT PRESENCE OF ULCERS (sore throat, sores in mouth, or a toothache) UNUSUAL RASH, SWELLING OR PAIN  UNUSUAL VAGINAL DISCHARGE OR ITCHING   Items with * indicate a potential emergency and should be followed up as soon as possible or go to the Emergency Department if any problems should occur.  Please show the CHEMOTHERAPY ALERT CARD or IMMUNOTHERAPY ALERT CARD at check-in to the Emergency Department and triage nurse.  Should you have questions after your visit or need to  cancel or reschedule your appointment, please contact [facility] [phone]  and follow the prompts.  Office hours are 8:00 a.m. to 4:30 p.m. Monday - Friday. Please note that voicemails left after 4:00 p.m. may not be returned until the following business day.  We are closed weekends and major holidays. You have access to a nurse at all times for urgent questions. Please call the main number to the clinic [phone] and follow the prompts.  For any non-urgent questions, you may also contact your provider using MyChart. We now offer e-Visits for anyone 18 and older to request care online for non-urgent symptoms. For details visit [email or web address].   Also download the MyChart app! Go to the app store, search "MyChart", open the app, select [facility], and log in with your MyChart username and password.   

## 2022-07-30 ENCOUNTER — Inpatient Hospital Stay: Payer: Medicare HMO | Attending: Hematology

## 2022-07-30 ENCOUNTER — Inpatient Hospital Stay: Payer: Medicare HMO

## 2022-07-30 DIAGNOSIS — N1831 Chronic kidney disease, stage 3a: Secondary | ICD-10-CM | POA: Insufficient documentation

## 2022-07-30 DIAGNOSIS — D649 Anemia, unspecified: Secondary | ICD-10-CM

## 2022-07-30 DIAGNOSIS — D631 Anemia in chronic kidney disease: Secondary | ICD-10-CM | POA: Diagnosis present

## 2022-07-30 LAB — CBC
HCT: 35.2 % — ABNORMAL LOW (ref 36.0–46.0)
Hemoglobin: 11.4 g/dL — ABNORMAL LOW (ref 12.0–15.0)
MCH: 28.9 pg (ref 26.0–34.0)
MCHC: 32.4 g/dL (ref 30.0–36.0)
MCV: 89.1 fL (ref 80.0–100.0)
Platelets: 165 10*3/uL (ref 150–400)
RBC: 3.95 MIL/uL (ref 3.87–5.11)
RDW: 14.8 % (ref 11.5–15.5)
WBC: 3.9 10*3/uL — ABNORMAL LOW (ref 4.0–10.5)
nRBC: 0 % (ref 0.0–0.2)

## 2022-07-30 NOTE — Progress Notes (Signed)
No Retacrit today for Hgb of 11.4.

## 2022-08-25 NOTE — Progress Notes (Unsigned)
Jennifer Li, [place] [address]   CLINIC:  Medical Oncology/Hematology  PCP:  System, Provider Not In No address on file None   REASON FOR VISIT:  Follow-up for iron deficiency anemia and anemia of CKD   PRIOR THERAPY: None   CURRENT THERAPY: Intermittent IV iron infusions, Retacrit 20,000 units every 4 weeks  INTERVAL HISTORY:   Jennifer Li 72 y.o. female returns for routine follow-up of her anemia of CKD.  She was last seen by Tarri Abernethy PA-C on 01/06/2022.  At today's visit, she reports feeling ***.  No recent hospitalizations, surgeries, or changes in baseline health status.  ***She continues to tolerate her Retacrit injections well.  She has not noticed any signs or symptoms of DVT or PE.  Blood pressure is at baseline.    *** She has not noticed any bleeding such as hematemesis, hematochezia, melena, or epistaxis. *** She reports that her energy is somewhat better with her improved hemoglobin. *** She denies any pica, restless legs, chest pain, dyspnea on exertion, lightheadedness, or syncope. *** She does have occasional headaches.   Regarding her mild thrombocytopenia (now resolved), she denies any symptoms of bruising, bleeding, or petechial rash.  *** *** She does not have any known history of liver disease, autoimmune disease, or connective tissue disorder. She does report that she takes several herbal supplements at home, but has stopped drinking dandelion tea and using red clover supplements.  She denies any quinine containing supplements.  She has ***% energy and ***% appetite. She endorses that she is maintaining a stable weight.   ASSESSMENT & PLAN:  1.  Anemia of CKD and iron deficiency - EGD and colonoscopy in March 2021 by Dr. Phillip Heal showed mild gastritis, diverticulosis, internal and external hemorrhoids, 1 polyp which was noncancerous. - Capsule endoscopy in September 2021 - Bone marrow biopsy on 08/07/2020,  normocellular marrow with trilineage hematopoiesis.  Mild polytypic plasmacytosis.  Chromosome analysis was normal.  SPEP was negative.  Free light chain ratio was normal with elevated kappa light chains consistent with CKD. - No prior history of blood transfusions.  No family history of sickle cell or thalassemia. - She received 1 g Venofer from 02/23/2021 - 03/09/2021 - She stopped iron tablet due to lack of improvement. - She denies any bleeding per rectum or melena.     - She was started on Retacrit in December 2022, current dose is 20,000 units Q4 weeks - Labs today (08/26/2022): *** - PLAN: Continue Retacrit 20,000 units every 4 weeks.  *** Decrease frequency???*** - Labs and RTC in 3 months.***   2.  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 - Lowest platelets 105 on 09/16/2021 - Nutritional panel (09/30/2021): Normal B12, folate, copper - She previously was taking Red Clover and dandelion tea.  She denies any quinine containing supplements or tonic water.   - No known history of liver disease, autoimmune disorder, or connective tissue disorder  - She denies any bleeding, bruising, petechial rash   *** - Most recent CBC shows normal platelets at 179*** - Differential diagnosis includes possible mild chronic ITP versus thrombocytopenic effect of herbal medication - PLAN: No indication for treatment at this time.  We will continue to monitor with CBCs as above.       3.  Stage IIIa/b CKD: - She follows with Dr. Christa See in [place], [place]***  PLAN SUMMARY: >> *** >> *** >> ***    REVIEW OF SYSTEMS: ***  Review of Systems - Oncology   PHYSICAL EXAM:  ECOG PERFORMANCE STATUS: {CHL ONC ECOG [id number] *** There were no vitals filed for this visit. There were no vitals filed for this visit. Physical Exam  PAST  MEDICAL/SURGICAL HISTORY:  Past Medical History:  Diagnosis Date   Arthritis    Cataract    Chronic kidney disease    stage 3   Heart murmur    Past Surgical History:  Procedure Laterality Date   ABDOMINAL HYSTERECTOMY     CATARACT EXTRACTION, BILATERAL     gaglion cyst removal     MYOMECTOMY      SOCIAL HISTORY:  Social History   Socioeconomic History   Marital status: Widowed    Spouse name: Not on file   Number of children: Not on file   Years of education: Not on file   Highest education level: Not on file  Occupational History   Not on file  Tobacco Use   Smoking status: Former   Smokeless tobacco: Never  Substance and Sexual Activity   Alcohol use: Never   Drug use: Never   Sexual activity: Not Currently  Other Topics Concern   Not on file  Social History Narrative   Not on file   Social Determinants of Health   Financial Resource Strain: Low Risk  (06/20/2020)   Overall Financial Resource Strain (CARDIA)    Difficulty of Paying Living Expenses: Not hard at all  Food Insecurity: Food Insecurity Present (06/20/2020)   Hunger Vital Sign    Worried About Running Out of Food in the Last Year: Sometimes true    Ran Out of Food in the Last Year: Sometimes true  Transportation Needs: Unmet Transportation Needs (06/20/2020)   PRAPARE - Hydrologist (Medical): Yes    Lack of Transportation (Non-Medical): No  Physical Activity: Not on file  Stress: No Stress Concern Present (06/20/2020)   [place]    Feeling of Stress : Not at all  Social Connections: Moderately Integrated (06/20/2020)   Social Connection and Isolation Panel [NHANES]    Frequency of Communication with Friends and Family: More than three times a week    Frequency of Social Gatherings with Friends and Family: Once a week    Attends Religious Services: More than 4 times per year    Active Member of [employer]  or Organizations: Yes    Attends Archivist Meetings: Never    Marital Status: Widowed  Intimate Partner Violence: Not At Risk (06/20/2020)   Humiliation, Afraid, Rape, and Kick questionnaire    Fear of Current or Ex-Partner: No    Emotionally Abused: No    Physically Abused: No    Sexually Abused: No    FAMILY HISTORY:  Family History  Problem Relation Age of Onset   Diabetes Sister    Diabetes Son     CURRENT MEDICATIONS:  Outpatient Encounter Medications as of 08/26/2022  Medication Sig   amLODipine (NORVASC) 10 MG tablet Take 10 mg by mouth daily.   ascorbic acid (VITAMIN C) 1000 MG tablet Vitamin C 1,000 mg tablet  Take 1 tablet every day by oral route.   cholecalciferol (VITAMIN D3) 25 MCG (1000 UNIT) tablet Take 1,000 Units by mouth daily.   cyanocobalamin (VITAMIN B12) 1000 MCG tablet Take 1,000 mcg by mouth daily.   losartan (COZAAR)  100 MG tablet losartan 100 mg tablet   Magnesium 250 MG TABS Take by mouth.   Metoprolol Tartrate 37.5 MG TABS Take 37.5 mg by mouth 2 (two) times daily.   Misc Natural Products (WHITE [place] BARK PO) Take by mouth.   niacin (VITAMIN B3) 500 MG ER tablet TAKE 1 TABLET BY MOUTH EVERY DAY AT BEDTIME FOR 90 DAYS   nitroGLYCERIN (NITROSTAT) 0.4 MG SL tablet nitroglycerin 0.4 mg sublingual tablet (Patient not taking: Reported on 06/04/2022)   spironolactone (ALDACTONE) 25 MG tablet spironolactone 25 mg tablet   triamcinolone ointment (KENALOG) 0.1 % Apply topically 2 (two) times daily.   valACYclovir (VALTREX) 500 MG tablet Take 1,000 mg by mouth as needed.   No facility-administered encounter medications on file as of 08/26/2022.    ALLERGIES:  Allergies  Allergen Reactions   Escitalopram    Gemfibrozil Hives and Other (See Comments)   Sulfa Antibiotics Hives   Sulfur    Trazodone    Valsartan Other (See Comments)    Patient says that she does not have allergy to this    LABORATORY DATA:  I have reviewed the labs as listed.   CBC    Component Value Date/Time   WBC 3.9 (L) 07/30/2022 1115   RBC 3.95 07/30/2022 1115   HGB 11.4 (L) 07/30/2022 1115   HCT 35.2 (L) 07/30/2022 1115   HCT 29.8 (L) 06/20/2020 1315   PLT 165 07/30/2022 1115   MCV 89.1 07/30/2022 1115   MCH 28.9 07/30/2022 1115   MCHC 32.4 07/30/2022 1115   RDW 14.8 07/30/2022 1115   LYMPHSABS 1.6 06/04/2022 0820   MONOABS 0.3 06/04/2022 0820   EOSABS 0.1 06/04/2022 0820   BASOSABS 0.0 06/04/2022 0820      Latest Ref Rng & Units 06/04/2022    8:20 AM 01/06/2022    8:34 AM 12/09/2021    9:10 AM  CMP  Glucose 70 - 99 mg/dL 93  96  85   BUN 8 - 23 mg/dL 45  27  15   Creatinine 0.44 - 1.00 mg/dL 1.53  1.39  1.19   Sodium 135 - 145 mmol/L 140  142  137   Potassium 3.5 - 5.1 mmol/L 4.3  4.2  4.0   Chloride 98 - 111 mmol/L 107  109  107   CO2 22 - 32 mmol/L 26  25  24    Calcium 8.9 - 10.3 mg/dL 10.0  9.6  9.3   Total Protein 6.5 - 8.1 g/dL 7.5  7.1    Total Bilirubin 0.3 - 1.2 mg/dL 0.9  0.8    Alkaline Phos 38 - 126 U/L 41  35    AST 15 - 41 U/L 21  19    ALT 0 - 44 U/L 18  15      DIAGNOSTIC IMAGING:  I have independently reviewed the relevant imaging and discussed with the patient.   WRAP UP:  All questions were answered. The patient knows to call the clinic with any problems, questions or concerns.  Medical decision making: ***  Time spent on visit: I spent *** minutes counseling the patient face to face. The total time spent in the appointment was *** minutes and more than 50% was on counseling.  Harriett Rush, PA-C  ***

## 2022-08-26 ENCOUNTER — Other Ambulatory Visit: Payer: Self-pay

## 2022-08-26 ENCOUNTER — Inpatient Hospital Stay (HOSPITAL_BASED_OUTPATIENT_CLINIC_OR_DEPARTMENT_OTHER): Payer: Medicare HMO | Admitting: Physician Assistant

## 2022-08-26 ENCOUNTER — Inpatient Hospital Stay: Payer: Medicare HMO

## 2022-08-26 VITALS — BP 116/76 | HR 62 | Temp 98.1°F | Resp 17 | Ht 61.0 in | Wt 125.4 lb

## 2022-08-26 DIAGNOSIS — N1831 Chronic kidney disease, stage 3a: Secondary | ICD-10-CM

## 2022-08-26 DIAGNOSIS — D696 Thrombocytopenia, unspecified: Secondary | ICD-10-CM

## 2022-08-26 DIAGNOSIS — D649 Anemia, unspecified: Secondary | ICD-10-CM

## 2022-08-26 DIAGNOSIS — D631 Anemia in chronic kidney disease: Secondary | ICD-10-CM

## 2022-08-26 LAB — CBC WITH DIFFERENTIAL/PLATELET
Abs Immature Granulocytes: 0 10*3/uL (ref 0.00–0.07)
Basophils Absolute: 0 10*3/uL (ref 0.0–0.1)
Basophils Relative: 1 %
Eosinophils Absolute: 0.1 10*3/uL (ref 0.0–0.5)
Eosinophils Relative: 2 %
HCT: 32.2 % — ABNORMAL LOW (ref 36.0–46.0)
Hemoglobin: 10.6 g/dL — ABNORMAL LOW (ref 12.0–15.0)
Immature Granulocytes: 0 %
Lymphocytes Relative: 37 %
Lymphs Abs: 1.4 10*3/uL (ref 0.7–4.0)
MCH: 28.7 pg (ref 26.0–34.0)
MCHC: 32.9 g/dL (ref 30.0–36.0)
MCV: 87.3 fL (ref 80.0–100.0)
Monocytes Absolute: 0.3 10*3/uL (ref 0.1–1.0)
Monocytes Relative: 8 %
Neutro Abs: 2 10*3/uL (ref 1.7–7.7)
Neutrophils Relative %: 52 %
Platelets: 135 10*3/uL — ABNORMAL LOW (ref 150–400)
RBC: 3.69 MIL/uL — ABNORMAL LOW (ref 3.87–5.11)
RDW: 15.2 % (ref 11.5–15.5)
WBC: 3.8 10*3/uL — ABNORMAL LOW (ref 4.0–10.5)
nRBC: 0 % (ref 0.0–0.2)

## 2022-08-26 LAB — COMPREHENSIVE METABOLIC PANEL
ALT: 17 U/L (ref 0–44)
AST: 18 U/L (ref 15–41)
Albumin: 3.9 g/dL (ref 3.5–5.0)
Alkaline Phosphatase: 36 U/L — ABNORMAL LOW (ref 38–126)
Anion gap: 8 (ref 5–15)
BUN: 23 mg/dL (ref 8–23)
CO2: 25 mmol/L (ref 22–32)
Calcium: 9.6 mg/dL (ref 8.9–10.3)
Chloride: 107 mmol/L (ref 98–111)
Creatinine, Ser: 1.2 mg/dL — ABNORMAL HIGH (ref 0.44–1.00)
GFR, Estimated: 48 mL/min — ABNORMAL LOW (ref >60)
Glucose, Bld: 94 mg/dL (ref 70–99)
Potassium: 4.5 mmol/L (ref 3.5–5.1)
Sodium: 140 mmol/L (ref 135–145)
Total Bilirubin: 1 mg/dL (ref 0.3–1.2)
Total Protein: 6.8 g/dL (ref 6.5–8.1)

## 2022-08-26 LAB — IRON AND TIBC
Iron: 61 ug/dL (ref 28–170)
Saturation Ratios: 21 % (ref 10.4–31.8)
TIBC: 294 ug/dL (ref 250–450)
UIBC: 233 ug/dL

## 2022-08-26 LAB — FERRITIN: Ferritin: 335 ng/mL — ABNORMAL HIGH (ref 11–307)

## 2022-08-26 MED ORDER — EPOETIN ALFA 20000 UNIT/ML IJ SOLN
20000.0000 [IU] | Freq: Once | INTRAMUSCULAR | Status: AC
  Administered 2022-08-26: 20000 [IU] via SUBCUTANEOUS
  Filled 2022-08-26: qty 1

## 2022-08-26 MED ORDER — EPOETIN ALFA 10000 UNIT/ML IJ SOLN
10000.0000 [IU] | Freq: Once | INTRAMUSCULAR | Status: AC
  Administered 2022-08-26: 10000 [IU] via SUBCUTANEOUS
  Filled 2022-08-26: qty 1

## 2022-08-26 NOTE — Patient Instructions (Signed)
[facility] at [facility] Discharge Instructions  You were seen today by Tarri Abernethy PA-C for your anemia.  Your blood counts have improved.  We will continue your Retacrit injections 20,000 units, but are decreasing the frequency to every 6 weeks.  We will continue to have your blood checked on the same day as your Retacrit injections.  FOLLOW-UP APPOINTMENT: Office visit in 18 weeks   - - - - - - - - - - - - - - - - - -   The website for  "[employer]" is an excellent resource on various herbal medications.  Although it does not include every herbal supplement available, it does include 250+ common supplements.  TheyParty.dk   - - - - - - - - - - - - - - - - - -   Thank you for choosing [facility] at [facility] to provide your oncology and hematology care.  To afford each patient quality time with our provider, please arrive at least 15 minutes before your scheduled appointment time.   If you have a lab appointment with the [facility] please come in thru the Main Entrance and check in at the main information desk.  You need to re-schedule your appointment should you arrive 10 or more minutes late.  We strive to give you quality time with our providers, and arriving late affects you and other patients whose appointments are after yours.  Also, if you no show three or more times for appointments you may be dismissed from the clinic at the providers discretion.     Again, thank you for choosing [facility].  Our hope is that these requests will decrease the amount of time that you wait before being seen by our physicians.       _____________________________________________________________  Should you have questions after your visit to [facility], please contact our office at [phone] and follow the prompts.  Our office hours are 8:00 a.m. and 4:30 p.m. Monday - Friday.  Please note that voicemails left after 4:00 p.m. may not be returned until the following business day.  We are closed weekends and major holidays.  You do have access to a nurse 24-7, just call the main number to the clinic [phone] and do not press any options, hold on the line and a nurse will answer the phone.    For prescription refill requests, have your pharmacy contact our office and allow 72 hours.    Due to Covid, you will need to wear a mask upon entering the hospital. If you do not have a mask, a mask will be given to you at the Main Entrance upon arrival. For doctor visits, patients may have 1 support person age 18 or older with them. For treatment visits, patients can not have anyone with them due to social distancing guidelines and our immunocompromised population.

## 2022-08-26 NOTE — Patient Instructions (Signed)
[facility]  Discharge Instructions: Thank you for choosing [facility] to provide your oncology and hematology care.  If you have a lab appointment with the Cancer Center, please come in thru the Main Entrance and check in at the main information desk.  Wear comfortable clothing and clothing appropriate for easy access to any Portacath or PICC line.   We strive to give you quality time with your provider. You may need to reschedule your appointment if you arrive late (15 or more minutes).  Arriving late affects you and other patients whose appointments are after yours.  Also, if you miss three or more appointments without notifying the office, you may be dismissed from the clinic at the provider's discretion.      For prescription refill requests, have your pharmacy contact our office and allow 72 hours for refills to be completed.    Today you received the following chemotherapy and/or immunotherapy agents Retacrit.  Epoetin Alfa Injection What is this medication? EPOETIN ALFA (e POE e tin AL fa) treats low levels of red blood cells (anemia) caused by kidney disease, chemotherapy, or HIV medications. It can also be used in people who are at risk for blood loss during surgery. It works by helping your body make more red blood cells, which reduces the need for blood transfusions. This medicine may be used for other purposes; ask your health care provider or pharmacist if you have questions. COMMON BRAND NAME(S): Epogen, Procrit, Retacrit What should I tell my care team before I take this medication? They need to know if you have any of these conditions: Blood clots Cancer Heart disease High blood pressure On dialysis Seizures Stroke An unusual or allergic reaction to epoetin alfa, albumin, benzyl alcohol, other medications, foods, dyes, or preservatives Pregnant or trying to get pregnant Breast-feeding How should I use this medication? This medication  is injected into a vein or under the skin. It is usually given by your care team in a hospital or clinic setting. It may also be given at home. If you get this medication at home, you will be taught how to prepare and give it. Use exactly as directed. Take it as directed on the prescription label at the same time every day. Keep taking it unless your care team tells you to stop. It is important that you put your used needles and syringes in a special sharps container. Do not put them in a trash can. If you do not have a sharps container, call your pharmacist or care team to get one. A special MedGuide will be given to you by the pharmacist with each prescription and refill. Be sure to read this information carefully each time. Talk to your care team about the use of this medication in children. While this medication may be used in children as young as 1 month of age for selected conditions, precautions do apply. Overdosage: If you think you have taken too much of this medicine contact a poison control center or emergency room at once. NOTE: This medicine is only for you. Do not share this medicine with others. What if I miss a dose? If you miss a dose, take it as soon as you can. If it is almost time for your next dose, take only that dose. Do not take double or extra doses. What may interact with this medication? Darbepoetin alfa Methoxy polyethylene glycol-epoetin beta This list may not describe all possible interactions. Give your health care provider   a list of all the medicines, herbs, non-prescription drugs, or dietary supplements you use. Also tell them if you smoke, drink alcohol, or use illegal drugs. Some items may interact with your medicine. What should I watch for while using this medication? Visit your care team for regular checks on your progress. Check your blood pressure as directed. Know what your blood pressure should be and when to contact your care team. Your condition will be  monitored carefully while you are receiving this medication. You may need blood work while taking this medication. What side effects may I notice from receiving this medication? Side effects that you should report to your care team as soon as possible: Allergic reactions--skin rash, itching, hives, swelling of the face, lips, tongue, or throat Blood clot--pain, swelling, or warmth in the leg, shortness of breath, chest pain Heart attack--pain or tightness in the chest, shoulders, arms, or jaw, nausea, shortness of breath, cold or clammy skin, feeling faint or lightheaded Increase in blood pressure Rash, fever, and swollen lymph nodes Redness, blistering, peeling, or loosening of the skin, including inside the mouth Seizures Stroke--sudden numbness or weakness of the face, arm, or leg, trouble speaking, confusion, trouble walking, loss of balance or coordination, dizziness, severe headache, change in vision Side effects that usually do not require medical attention (report to your care team if they continue or are bothersome): Bone, joint, or muscle pain Cough Headache Nausea Pain, redness, or irritation at injection site This list may not describe all possible side effects. Call your doctor for medical advice about side effects. You may report side effects to FDA at 1-800-FDA-1088. Where should I keep my medication? Keep out of the reach of children and pets. Store in a refrigerator. Do not freeze. Do not shake. Protect from light. Keep this medication in the original container until you are ready to take it. See product for storage information. Get rid of any unused medication after the expiration date. To get rid of medications that are no longer needed or have expired: Take the medication to a medication take-back program. Check with your pharmacy or law enforcement to find a location. If you cannot return the medication, ask your pharmacist or care team how to get rid of the medication  safely. NOTE: This sheet is a summary. It may not cover all possible information. If you have questions about this medicine, talk to your doctor, pharmacist, or health care provider.  2023 Elsevier/Gold Standard (2021-08-25 00:00:00)       To help prevent nausea and vomiting after your treatment, we encourage you to take your nausea medication as directed.  BELOW ARE SYMPTOMS THAT SHOULD BE REPORTED IMMEDIATELY: *FEVER GREATER THAN 100.4 F (38 C) OR HIGHER *CHILLS OR SWEATING *NAUSEA AND VOMITING THAT IS NOT CONTROLLED WITH YOUR NAUSEA MEDICATION *UNUSUAL SHORTNESS OF BREATH *UNUSUAL BRUISING OR BLEEDING *URINARY PROBLEMS (pain or burning when urinating, or frequent urination) *BOWEL PROBLEMS (unusual diarrhea, constipation, pain near the anus) TENDERNESS IN MOUTH AND THROAT WITH OR WITHOUT PRESENCE OF ULCERS (sore throat, sores in mouth, or a toothache) UNUSUAL RASH, SWELLING OR PAIN  UNUSUAL VAGINAL DISCHARGE OR ITCHING   Items with * indicate a potential emergency and should be followed up as soon as possible or go to the Emergency Department if any problems should occur.  Please show the CHEMOTHERAPY ALERT CARD or IMMUNOTHERAPY ALERT CARD at check-in to the Emergency Department and triage nurse.  Should you have questions after your visit or need to cancel or reschedule   your appointment, please contact [facility] [phone]  and follow the prompts.  Office hours are 8:00 a.m. to 4:30 p.m. Monday - Friday. Please note that voicemails left after 4:00 p.m. may not be returned until the following business day.  We are closed weekends and major holidays. You have access to a nurse at all times for urgent questions. Please call the main number to the clinic [phone] and follow the prompts.  For any non-urgent questions, you may also contact your provider using MyChart. We now offer e-Visits for anyone 18 and older to request care online for non-urgent symptoms.  For details visit [email or web address].   Also download the MyChart app! Go to the app store, search "MyChart", open the app, select [facility], and log in with your MyChart username and password.   

## 2022-08-26 NOTE — Progress Notes (Signed)
Patient tolerated Retacrit 30,000 U injection with no complaints voiced.  Hemoglobin today is 10.4.  Site clean and dry with no bruising or swelling noted.  No complaints of pain.  Discharged with vital signs stable and no signs or symptoms of distress noted.    Patient tolerated injection well with no complaints voiced.  Patient left ambulatory in stable condition.  Vital signs stable at discharge.  Follow up as scheduled.

## 2022-10-07 ENCOUNTER — Inpatient Hospital Stay: Payer: Medicare HMO | Attending: Hematology

## 2022-10-07 ENCOUNTER — Inpatient Hospital Stay: Payer: Medicare HMO

## 2022-10-07 DIAGNOSIS — N1831 Chronic kidney disease, stage 3a: Secondary | ICD-10-CM | POA: Insufficient documentation

## 2022-10-07 DIAGNOSIS — D631 Anemia in chronic kidney disease: Secondary | ICD-10-CM | POA: Insufficient documentation

## 2022-10-07 DIAGNOSIS — D649 Anemia, unspecified: Secondary | ICD-10-CM

## 2022-10-07 LAB — CBC
HCT: 36.8 % (ref 36.0–46.0)
Hemoglobin: 12.1 g/dL (ref 12.0–15.0)
MCH: 29.3 pg (ref 26.0–34.0)
MCHC: 32.9 g/dL (ref 30.0–36.0)
MCV: 89.1 fL (ref 80.0–100.0)
Platelets: 153 10*3/uL (ref 150–400)
RBC: 4.13 MIL/uL (ref 3.87–5.11)
RDW: 15.9 % — ABNORMAL HIGH (ref 11.5–15.5)
WBC: 5.8 10*3/uL (ref 4.0–10.5)
nRBC: 0 % (ref 0.0–0.2)

## 2022-10-07 NOTE — Progress Notes (Signed)
No injection needed today due today hemoglobin of 12.1, labs printed for patient and discharged in satisfied condition.

## 2022-11-11 ENCOUNTER — Other Ambulatory Visit: Payer: Self-pay | Admitting: *Deleted

## 2022-11-11 DIAGNOSIS — N1831 Chronic kidney disease, stage 3a: Secondary | ICD-10-CM

## 2022-11-18 ENCOUNTER — Inpatient Hospital Stay: Payer: Medicare HMO | Attending: Hematology

## 2022-11-18 ENCOUNTER — Inpatient Hospital Stay: Payer: Medicare HMO

## 2022-11-18 VITALS — BP 146/83 | HR 67 | Temp 97.1°F | Resp 16 | Wt 128.2 lb

## 2022-11-18 DIAGNOSIS — D696 Thrombocytopenia, unspecified: Secondary | ICD-10-CM | POA: Insufficient documentation

## 2022-11-18 DIAGNOSIS — D649 Anemia, unspecified: Secondary | ICD-10-CM

## 2022-11-18 DIAGNOSIS — N1831 Chronic kidney disease, stage 3a: Secondary | ICD-10-CM

## 2022-11-18 DIAGNOSIS — D631 Anemia in chronic kidney disease: Secondary | ICD-10-CM | POA: Insufficient documentation

## 2022-11-18 LAB — CBC
HCT: 31.1 % — ABNORMAL LOW (ref 36.0–46.0)
Hemoglobin: 10.2 g/dL — ABNORMAL LOW (ref 12.0–15.0)
MCH: 29.5 pg (ref 26.0–34.0)
MCHC: 32.8 g/dL (ref 30.0–36.0)
MCV: 89.9 fL (ref 80.0–100.0)
Platelets: 159 10*3/uL (ref 150–400)
RBC: 3.46 MIL/uL — ABNORMAL LOW (ref 3.87–5.11)
RDW: 14.9 % (ref 11.5–15.5)
WBC: 4.9 10*3/uL (ref 4.0–10.5)
nRBC: 0 % (ref 0.0–0.2)

## 2022-11-18 LAB — RENAL FUNCTION PANEL
Albumin: 4.3 g/dL (ref 3.5–5.0)
Anion gap: 10 (ref 5–15)
BUN: 26 mg/dL — ABNORMAL HIGH (ref 8–23)
CO2: 21 mmol/L — ABNORMAL LOW (ref 22–32)
Calcium: 9.8 mg/dL (ref 8.9–10.3)
Chloride: 107 mmol/L (ref 98–111)
Creatinine, Ser: 1.22 mg/dL — ABNORMAL HIGH (ref 0.44–1.00)
GFR, Estimated: 47 mL/min — ABNORMAL LOW (ref >60)
Glucose, Bld: 98 mg/dL (ref 70–99)
Phosphorus: 3.9 mg/dL (ref 2.5–4.6)
Potassium: 4.5 mmol/L (ref 3.5–5.1)
Sodium: 138 mmol/L (ref 135–145)

## 2022-11-18 MED ORDER — EPOETIN ALFA 20000 UNIT/ML IJ SOLN
20000.0000 [IU] | Freq: Once | INTRAMUSCULAR | Status: AC
  Administered 2022-11-18: 20000 [IU] via SUBCUTANEOUS
  Filled 2022-11-18: qty 1

## 2022-11-18 NOTE — Patient Instructions (Signed)
[facility]  Discharge Instructions: Thank you for choosing [facility] to provide your oncology and hematology care.  If you have a lab appointment with the Cancer Center - please note that after April 8th, 2024, all labs will be drawn in the cancer center.  You do not have to check in or register with the main entrance as you have in the past but will complete your check-in in the cancer center.  Wear comfortable clothing and clothing appropriate for easy access to any Portacath or PICC line.   We strive to give you quality time with your provider. You may need to reschedule your appointment if you arrive late (15 or more minutes).  Arriving late affects you and other patients whose appointments are after yours.  Also, if you miss three or more appointments without notifying the office, you may be dismissed from the clinic at the provider's discretion.      For prescription refill requests, have your pharmacy contact our office and allow 72 hours for refills to be completed.    Today you received the following chemotherapy and/or immunotherapy agents Retacrit      To help prevent nausea and vomiting after your treatment, we encourage you to take your nausea medication as directed.  BELOW ARE SYMPTOMS THAT SHOULD BE REPORTED IMMEDIATELY: *FEVER GREATER THAN 100.4 F (38 C) OR HIGHER *CHILLS OR SWEATING *NAUSEA AND VOMITING THAT IS NOT CONTROLLED WITH YOUR NAUSEA MEDICATION *UNUSUAL SHORTNESS OF BREATH *UNUSUAL BRUISING OR BLEEDING *URINARY PROBLEMS (pain or burning when urinating, or frequent urination) *BOWEL PROBLEMS (unusual diarrhea, constipation, pain near the anus) TENDERNESS IN MOUTH AND THROAT WITH OR WITHOUT PRESENCE OF ULCERS (sore throat, sores in mouth, or a toothache) UNUSUAL RASH, SWELLING OR PAIN  UNUSUAL VAGINAL DISCHARGE OR ITCHING   Items with * indicate a potential emergency and should be followed up as soon as possible or go to the  Emergency Department if any problems should occur.  Please show the CHEMOTHERAPY ALERT CARD or IMMUNOTHERAPY ALERT CARD at check-in to the Emergency Department and triage nurse.  Should you have questions after your visit or need to cancel or reschedule your appointment, please contact [facility] [phone]  and follow the prompts.  Office hours are 8:00 a.m. to 4:30 p.m. Monday - Friday. Please note that voicemails left after 4:00 p.m. may not be returned until the following business day.  We are closed weekends and major holidays. You have access to a nurse at all times for urgent questions. Please call the main number to the clinic [phone] and follow the prompts.  For any non-urgent questions, you may also contact your provider using MyChart. We now offer e-Visits for anyone 18 and older to request care online for non-urgent symptoms. For details visit [email or web address].   Also download the MyChart app! Go to the app store, search "MyChart", open the app, select [facility], and log in with your MyChart username and password.   

## 2022-11-18 NOTE — Progress Notes (Signed)
Jennifer Li presents today for Retacrit injection per the provider's orders.  Stable during administration without incident; injection site WNL; see MAR for injection details.  Patient tolerated procedure well and without incident.  No questions or complaints noted at this time.  

## 2022-12-30 ENCOUNTER — Inpatient Hospital Stay: Payer: Medicare HMO

## 2022-12-30 ENCOUNTER — Inpatient Hospital Stay: Payer: Medicare HMO | Attending: Hematology | Admitting: Oncology

## 2022-12-30 VITALS — BP 142/75 | HR 68 | Temp 98.2°F | Resp 16 | Wt 126.8 lb

## 2022-12-30 DIAGNOSIS — D649 Anemia, unspecified: Secondary | ICD-10-CM

## 2022-12-30 DIAGNOSIS — D631 Anemia in chronic kidney disease: Secondary | ICD-10-CM | POA: Diagnosis present

## 2022-12-30 DIAGNOSIS — N1831 Chronic kidney disease, stage 3a: Secondary | ICD-10-CM | POA: Insufficient documentation

## 2022-12-30 DIAGNOSIS — Z79899 Other long term (current) drug therapy: Secondary | ICD-10-CM | POA: Diagnosis not present

## 2022-12-30 LAB — CBC WITH DIFFERENTIAL/PLATELET
Abs Immature Granulocytes: 0.01 10*3/uL (ref 0.00–0.07)
Basophils Absolute: 0 10*3/uL (ref 0.0–0.1)
Basophils Relative: 1 %
Eosinophils Absolute: 0.1 10*3/uL (ref 0.0–0.5)
Eosinophils Relative: 2 %
HCT: 33.5 % — ABNORMAL LOW (ref 36.0–46.0)
Hemoglobin: 10.9 g/dL — ABNORMAL LOW (ref 12.0–15.0)
Immature Granulocytes: 0 %
Lymphocytes Relative: 37 %
Lymphs Abs: 1.5 10*3/uL (ref 0.7–4.0)
MCH: 29.2 pg (ref 26.0–34.0)
MCHC: 32.5 g/dL (ref 30.0–36.0)
MCV: 89.8 fL (ref 80.0–100.0)
Monocytes Absolute: 0.3 10*3/uL (ref 0.1–1.0)
Monocytes Relative: 7 %
Neutro Abs: 2.2 10*3/uL (ref 1.7–7.7)
Neutrophils Relative %: 53 %
Platelets: 139 10*3/uL — ABNORMAL LOW (ref 150–400)
RBC: 3.73 MIL/uL — ABNORMAL LOW (ref 3.87–5.11)
RDW: 14.6 % (ref 11.5–15.5)
WBC: 4.2 10*3/uL (ref 4.0–10.5)
nRBC: 0 % (ref 0.0–0.2)

## 2022-12-30 LAB — COMPREHENSIVE METABOLIC PANEL
ALT: 16 U/L (ref 0–44)
AST: 21 U/L (ref 15–41)
Albumin: 4.2 g/dL (ref 3.5–5.0)
Alkaline Phosphatase: 36 U/L — ABNORMAL LOW (ref 38–126)
Anion gap: 6 (ref 5–15)
BUN: 24 mg/dL — ABNORMAL HIGH (ref 8–23)
CO2: 26 mmol/L (ref 22–32)
Calcium: 9.4 mg/dL (ref 8.9–10.3)
Chloride: 108 mmol/L (ref 98–111)
Creatinine, Ser: 1.16 mg/dL — ABNORMAL HIGH (ref 0.44–1.00)
GFR, Estimated: 49 mL/min — ABNORMAL LOW (ref >60)
Glucose, Bld: 113 mg/dL — ABNORMAL HIGH (ref 70–99)
Potassium: 4 mmol/L (ref 3.5–5.1)
Sodium: 140 mmol/L (ref 135–145)
Total Bilirubin: 1 mg/dL (ref 0.3–1.2)
Total Protein: 7.1 g/dL (ref 6.5–8.1)

## 2022-12-30 LAB — IRON AND TIBC
Iron: 61 ug/dL (ref 28–170)
Saturation Ratios: 21 % (ref 10.4–31.8)
TIBC: 287 ug/dL (ref 250–450)
UIBC: 226 ug/dL

## 2022-12-30 LAB — FERRITIN: Ferritin: 289 ng/mL (ref 11–307)

## 2022-12-30 MED ORDER — EPOETIN ALFA 20000 UNIT/ML IJ SOLN
20000.0000 [IU] | Freq: Once | INTRAMUSCULAR | Status: AC
  Administered 2022-12-30: 20000 [IU] via SUBCUTANEOUS
  Filled 2022-12-30: qty 1

## 2022-12-30 NOTE — Progress Notes (Signed)
Epogen injection given per orders. Patient tolerated it well without problems. Vitals stable and discharged home from clinic ambulatory. Follow up as scheduled.

## 2022-12-30 NOTE — Progress Notes (Signed)
Jennifer Li [address], [place] [address]   CLINIC:  Medical Oncology/Hematology  PCP:  System, Provider Not In No address on file None   REASON FOR VISIT:  Follow-up for iron deficiency anemia and anemia of CKD   PRIOR THERAPY: None   CURRENT THERAPY: Intermittent IV iron infusions, Retacrit 20,000 units every 4 weeks  INTERVAL HISTORY:   Jennifer Li 72 y.o. female returns for routine follow-up of her anemia of CKD.  She was last seen in clinic by Rojelio Brenner on 08/26/2022.  She reports overall she is doing well.  No recent hospitalizations, surgeries or changes in health.   She continues monthly Epogen injections and intermittent IV iron..  Hemoglobin baseline is between 9.5 and 11.  Denies any pain.  Appetite and energy levels are 100%.  Has occasional headaches but they are self-limiting.  She continues to tolerate her Epogen injections well.  She has not noticed any signs or symptoms of DVT or PE.  Blood pressure is at baseline.   She has not noticed any bleeding such as hematemesis, hematochezia, melena, or epistaxis.  She reports that her energy is better with her improved hemoglobin.  She denies any pica, restless legs, chest pain, dyspnea on exertion, lightheadedness, or syncope.  She does have occasional headaches.   Has intermittent mild thrombocytopenia but denies any bruising, bleeding or petechial rash.   ASSESSMENT & PLAN:  1.  Anemia of CKD and iron deficiency - EGD and colonoscopy in March 2021 by Dr. Cheree Ditto showed mild gastritis, diverticulosis, internal and external hemorrhoids, 1 polyp which was noncancerous. - Capsule endoscopy in September 2021 - Bone marrow biopsy on 08/07/2020, normocellular marrow with trilineage hematopoiesis.  Mild polytypic plasmacytosis.  Chromosome analysis was normal.  SPEP was negative.  Free light chain ratio was normal with elevated kappa light chains consistent with CKD. - No prior history of blood  transfusions.  No family history of sickle cell or thalassemia. - She received 1 g Venofer from 02/23/2021 - 03/09/2021 - She stopped iron tablet due to lack of improvement. - She denies any bleeding per rectum or melena.     - She was started on Retacrit in December 2022, current dose is 20,000 units Q4 weeks - Labs today show hemoglobin of hide 10.9, platelets 139,000 with a normal differential.  Ferritin is 289 with iron saturation of 21%.  Creatinine 1.16 with normal LFTs. - PLAN: We will decrease frequency of Retacrit to 20,000 units every 6 weeks. - Labs and RTC in 18 weeks.  2.  Mild thrombocytopenia and leukopenia - Patient has had intermittent mild thrombocytopenia since March 2022.  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 - Bone marrow biopsy (08/07/2020): Normocellular marrow with trilineage hematopoiesis, as described above. - Lowest platelets 105 on 09/16/2021 - Nutritional panel (09/30/2021): Normal B12, folate, copper - She occasionally takes Red Clover and drinks dandelion tea.  She denies any quinine containing supplements or tonic water.   - No known history of liver disease, autoimmune disorder, or connective tissue disorder  - She denies any bleeding, bruising, petechial rash    - Most recent CBC shows mild thrombocytopenia with platelets 139.  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  - PLAN: No indication for treatment at this time.  We will continue to monitor with CBCs as above.   If significant worsening or  persistence of thrombocytopenia, would consider bone marrow biopsy.      3.  Stage IIIa/b CKD: - She follows with Dr. Ramiro Harvest in [place], [place]  PLAN SUMMARY: >> CBC + Epogen every 6 weeks >> Same-day labs (CBC/D, CMP, ferritin, iron/TIBC) + injection + OFFICE visit in 18 weeks     REVIEW OF  SYSTEMS:   Review of Systems  Constitutional:  Negative for appetite change, chills, diaphoresis, fatigue, fever and unexpected weight change.  HENT:   Negative for lump/mass and nosebleeds.   Eyes:  Negative for eye problems.  Respiratory:  Negative for cough, hemoptysis and shortness of breath.   Cardiovascular:  Negative for chest pain, leg swelling and palpitations.  Gastrointestinal:  Negative for abdominal pain, blood in stool, constipation, diarrhea, nausea and vomiting.  Genitourinary:  Negative for hematuria.   Skin:  Positive for itching.  Neurological:  Positive for headaches. Negative for dizziness and light-headedness.  Hematological:  Does not bruise/bleed easily.     PHYSICAL EXAM:  ECOG PERFORMANCE STATUS: 0 - Asymptomatic  Vitals:   12/30/22 1151 12/30/22 1156  BP: (!) 152/76 (!) 142/75  Pulse: 68   Resp: 16   Temp: 98.2 F (36.8 C)   SpO2: 100%    Filed Weights   12/30/22 1151  Weight: 126 lb 12.2 oz (57.5 kg)   Physical Exam Constitutional:      Appearance: Normal appearance.  Cardiovascular:     Rate and Rhythm: Normal rate and regular rhythm.  Pulmonary:     Effort: Pulmonary effort is normal.     Breath sounds: Normal breath sounds.  Abdominal:     General: Bowel sounds are normal.     Palpations: Abdomen is soft.  Musculoskeletal:        General: No swelling. Normal range of motion.  Neurological:     Mental Status: She is alert and oriented to person, place, and time. Mental status is at baseline.    PAST MEDICAL/SURGICAL HISTORY:  Past Medical History:  Diagnosis Date   Arthritis    Cataract    Chronic kidney disease    stage 3   Heart murmur    Past Surgical History:  Procedure Laterality Date   ABDOMINAL HYSTERECTOMY     CATARACT EXTRACTION, BILATERAL     gaglion cyst removal     MYOMECTOMY      SOCIAL HISTORY:  Social History   Socioeconomic History   Marital status: Widowed    Spouse name: Not on file   Number of  children: Not on file   Years of education: Not on file   Highest education level: Not on file  Occupational History   Not on file  Tobacco Use   Smoking status: Former   Smokeless tobacco: Never  Substance and Sexual Activity   Alcohol use: Never   Drug use: Never   Sexual activity: Not Currently  Other Topics Concern   Not on file  Social History Narrative   Not on file   Social Determinants of Health   Financial Resource Strain: Low Risk  (06/20/2020)   Overall Financial Resource Strain (CARDIA)    Difficulty of Paying Living Expenses: Not hard at all  Food Insecurity: Food Insecurity Present (06/20/2020)   Hunger Vital Sign    Worried About Running Out of Food in the Last Year: Sometimes true    Ran Out of Food in the Last Year: Sometimes true  Transportation Needs: Unmet Transportation Needs (06/20/2020)   PRAPARE - Transportation  Lack of Transportation (Medical): Yes    Lack of Transportation (Non-Medical): No  Physical Activity: Not on file  Stress: No Stress Concern Present (06/20/2020)   [employer] of Occupational Health - Occupational Stress Questionnaire    Feeling of Stress : Not at all  Social Connections: Moderately Integrated (06/20/2020)   Social Connection and Isolation Panel [NHANES]    Frequency of Communication with Friends and Family: More than three times a week    Frequency of Social Gatherings with Friends and Family: Once a week    Attends Religious Services: More than 4 times per year    Active Member of [employer] or Organizations: Yes    Attends Banker Meetings: Never    Marital Status: Widowed  Intimate Partner Violence: Not At Risk (06/20/2020)   Humiliation, Afraid, Rape, and Kick questionnaire    Fear of Current or Ex-Partner: No    Emotionally Abused: No    Physically Abused: No    Sexually Abused: No    FAMILY HISTORY:  Family History  Problem Relation Age of Onset   Diabetes Sister    Diabetes Son     CURRENT  MEDICATIONS:  Outpatient Encounter Medications as of 12/30/2022  Medication Sig   amLODipine (NORVASC) 10 MG tablet Take 10 mg by mouth daily.   ascorbic acid (VITAMIN C) 1000 MG tablet Vitamin C 1,000 mg tablet  Take 1 tablet every day by oral route.   B Complex-C (B-COMPLEX WITH VITAMIN C) tablet Take by mouth.   cholecalciferol (VITAMIN D3) 25 MCG (1000 UNIT) tablet Take 1,000 Units by mouth daily.   cyanocobalamin (VITAMIN B12) 1000 MCG tablet Take 1,000 mcg by mouth daily.   losartan (COZAAR) 100 MG tablet losartan 100 mg tablet   Magnesium 250 MG TABS Take by mouth.   Metoprolol Tartrate 37.5 MG TABS Take 37.5 mg by mouth 2 (two) times daily.   Misc Natural Products (WHITE [place] BARK PO) Take by mouth.   niacin (VITAMIN B3) 500 MG ER tablet TAKE 1 TABLET BY MOUTH EVERY DAY AT BEDTIME FOR 90 DAYS   RESTASIS 0.05 % ophthalmic emulsion SMARTSIG:1 Drop(s) In Eye(s) Every 12 Hours   spironolactone (ALDACTONE) 25 MG tablet spironolactone 25 mg tablet   triamcinolone ointment (KENALOG) 0.1 % Apply topically 2 (two) times daily.   valACYclovir (VALTREX) 500 MG tablet Take 1,000 mg by mouth as needed.   nitroGLYCERIN (NITROSTAT) 0.4 MG SL tablet  (Patient not taking: Reported on 12/30/2022)   No facility-administered encounter medications on file as of 12/30/2022.    ALLERGIES:  Allergies  Allergen Reactions   Escitalopram    Gemfibrozil Hives and Other (See Comments)   Sulfa Antibiotics Hives   Sulfur    Trazodone    Valsartan Other (See Comments)    Patient says that she does not have allergy to this    LABORATORY DATA:  I have reviewed the labs as listed.  CBC    Component Value Date/Time   WBC 4.2 12/30/2022 1029   RBC 3.73 (L) 12/30/2022 1029   HGB 10.9 (L) 12/30/2022 1029   HCT 33.5 (L) 12/30/2022 1029   HCT 29.8 (L) 06/20/2020 1315   PLT 139 (L) 12/30/2022 1029   MCV 89.8 12/30/2022 1029   MCH 29.2 12/30/2022 1029   MCHC 32.5 12/30/2022 1029   RDW 14.6 12/30/2022 1029    LYMPHSABS 1.5 12/30/2022 1029   MONOABS 0.3 12/30/2022 1029   EOSABS 0.1 12/30/2022 1029   BASOSABS 0.0 12/30/2022 1029  Latest Ref Rng & Units 12/30/2022   10:29 AM 11/18/2022   11:10 AM 08/26/2022    9:26 AM  CMP  Glucose 70 - 99 mg/dL 829  98  94   BUN 8 - 23 mg/dL 24  26  23    Creatinine 0.44 - 1.00 mg/dL 5.62  1.30  8.65   Sodium 135 - 145 mmol/L 140  138  140   Potassium 3.5 - 5.1 mmol/L 4.0  4.5  4.5   Chloride 98 - 111 mmol/L 108  107  107   CO2 22 - 32 mmol/L 26  21  25    Calcium 8.9 - 10.3 mg/dL 9.4  9.8  9.6   Total Protein 6.5 - 8.1 g/dL 7.1   6.8   Total Bilirubin 0.3 - 1.2 mg/dL 1.0   1.0   Alkaline Phos 38 - 126 U/L 36   36   AST 15 - 41 U/L 21   18   ALT 0 - 44 U/L 16   17     DIAGNOSTIC IMAGING:  I have independently reviewed the relevant imaging and discussed with the patient.   WRAP UP:  All questions were answered. The patient knows to call the clinic with any problems, questions or concerns.  Medical decision making: Moderate  Time spent on visit: I spent 20 minutes counseling the patient face to face. The total time spent in the appointment was 30 minutes and more than 50% was on counseling.  Mauro Kaufmann, NP  08/26/2022 1:01 PM

## 2023-02-06 IMAGING — CT CT BIOPSY
1 of 2 series · 15 of 28 positions shown, 19 images · non-contrast
Comparison: none

INDICATION: 70-year-old female with anemia of uncertain etiology. She presents
for bone marrow biopsy.

[Series 2: i-spiral 5.0 br40 · axial · 0.95mm/px · z∈[-146,-69]mm · 15 of 25 slices shown, 19 images]
[im 2/25  mediastinal]
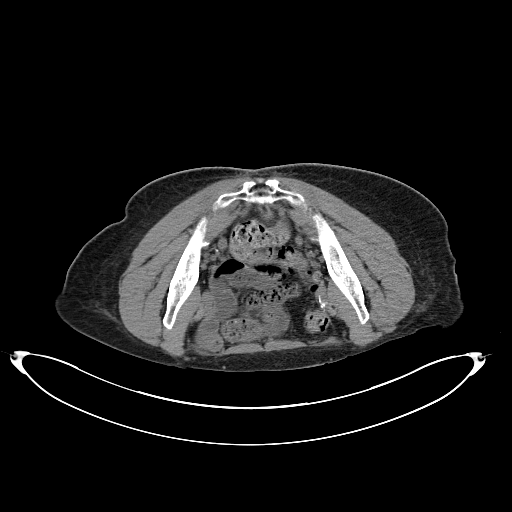
[im 2/25  lung]
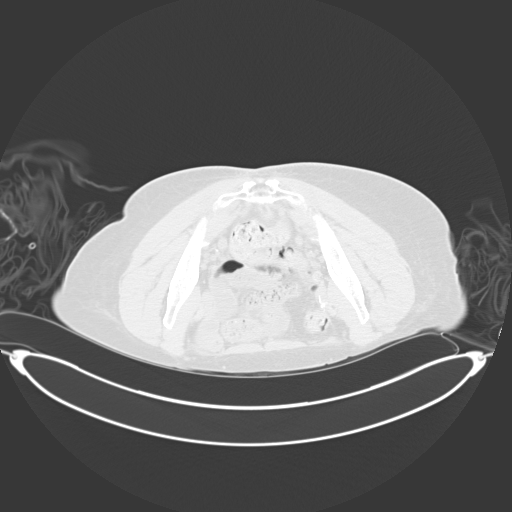
[im 4/25  lung]
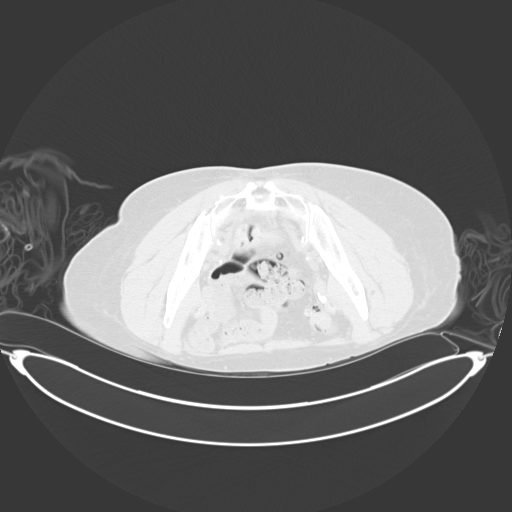
[im 5/25  lung]
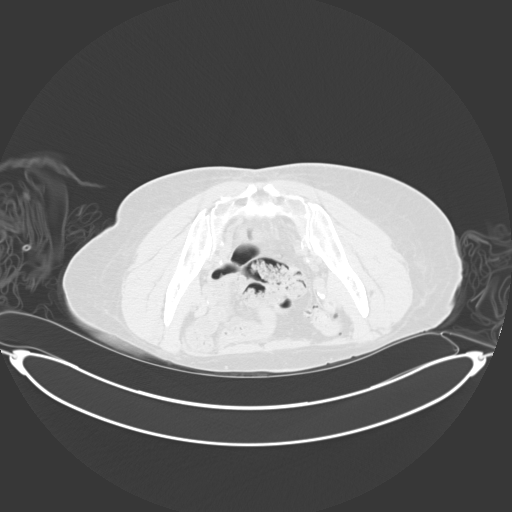
[im 7/25  lung]
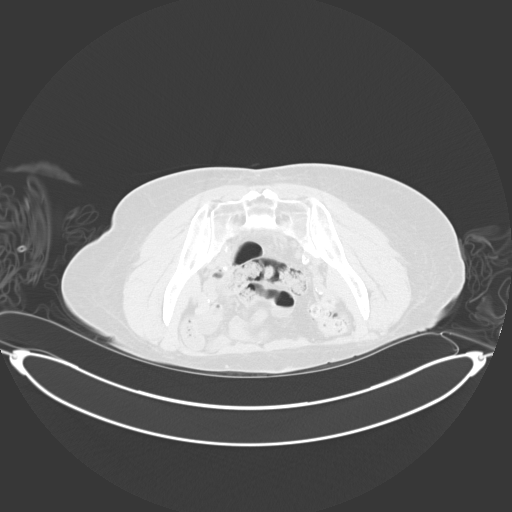
[im 8/25  mediastinal]
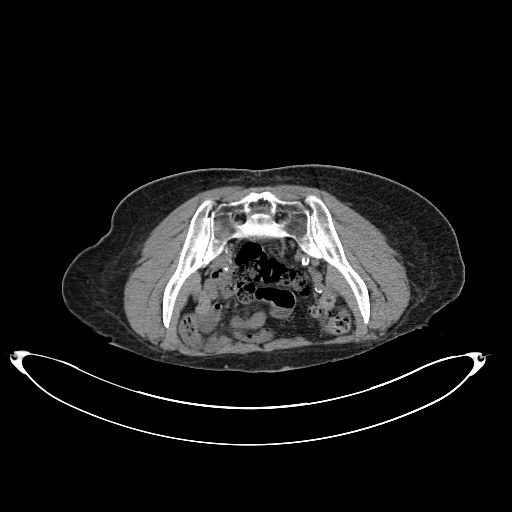
[im 8/25  lung]
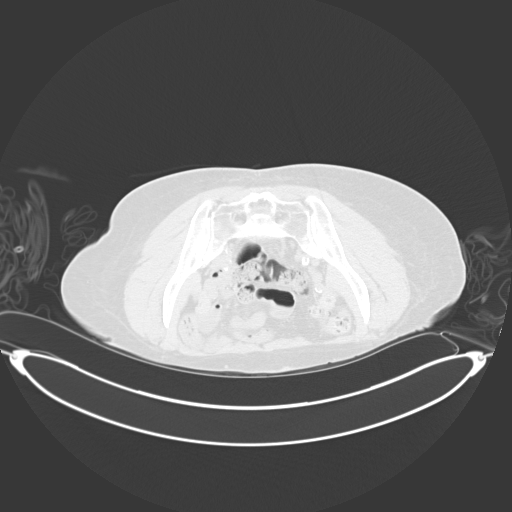
[im 10/25  lung]
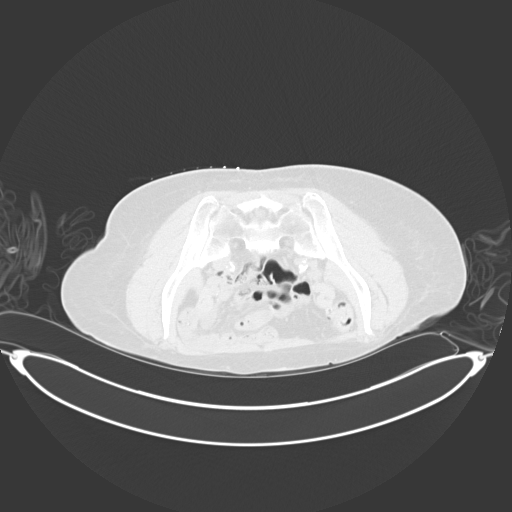
[im 11/25  lung]
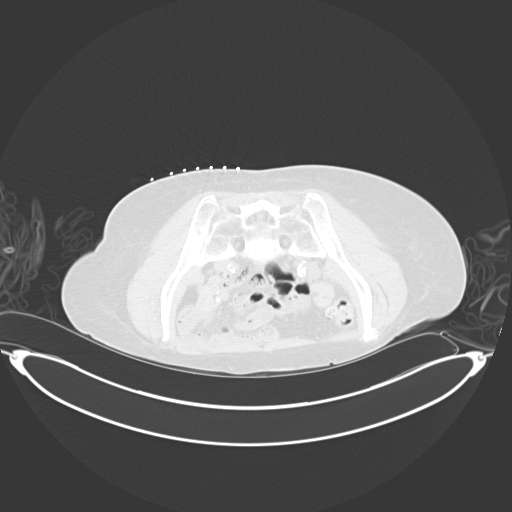
[im 13/25  lung]
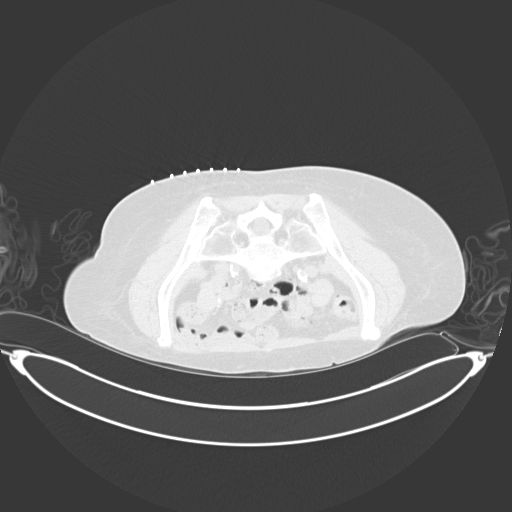
[im 15/25  mediastinal]
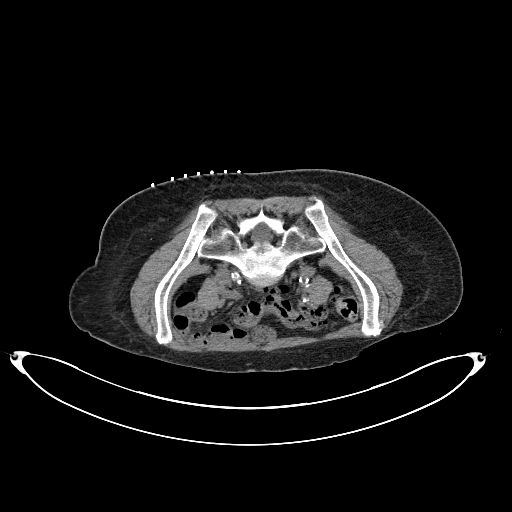
[im 15/25  lung]
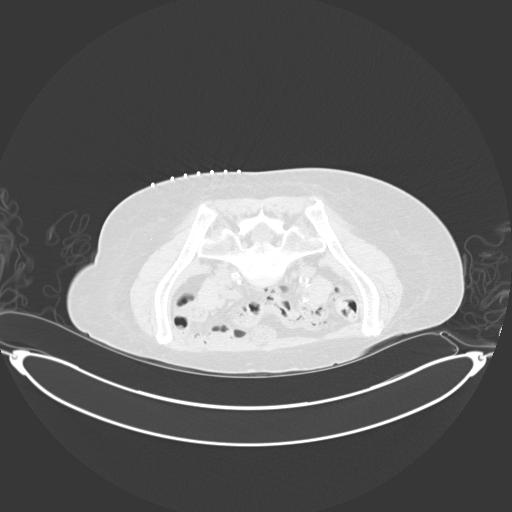
[im 16/25  lung]
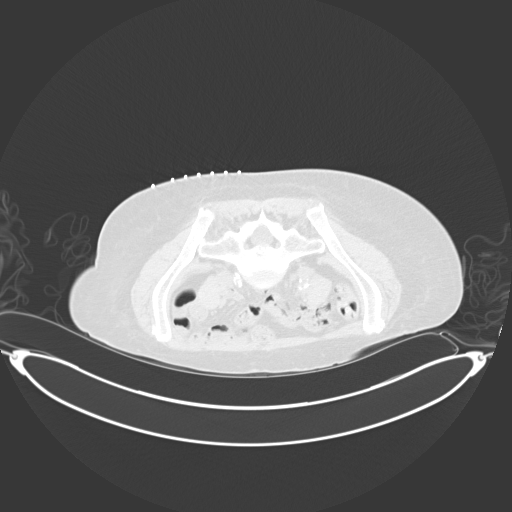
[im 18/25  lung]
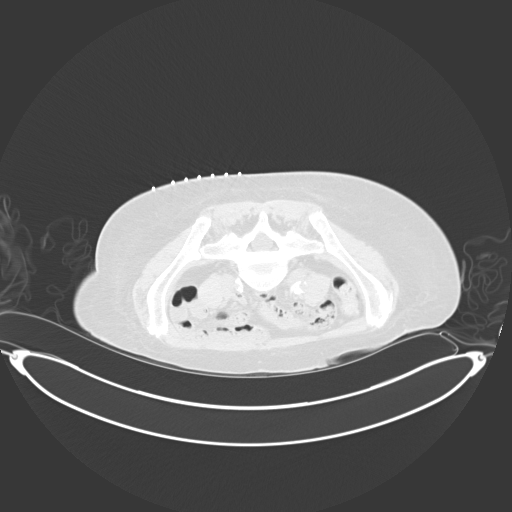
[im 19/25  lung]
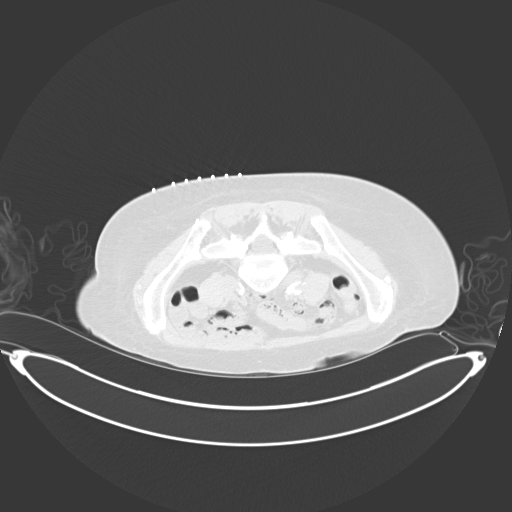
[im 21/25  mediastinal]
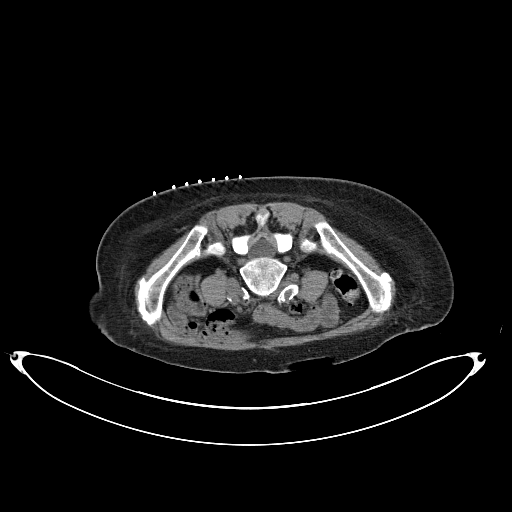
[im 21/25  lung]
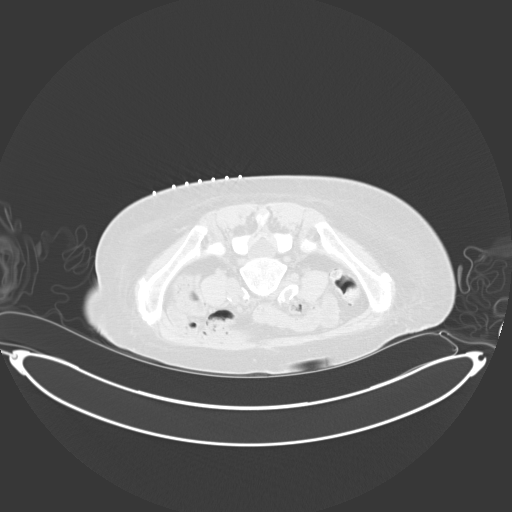
[im 22/25  lung]
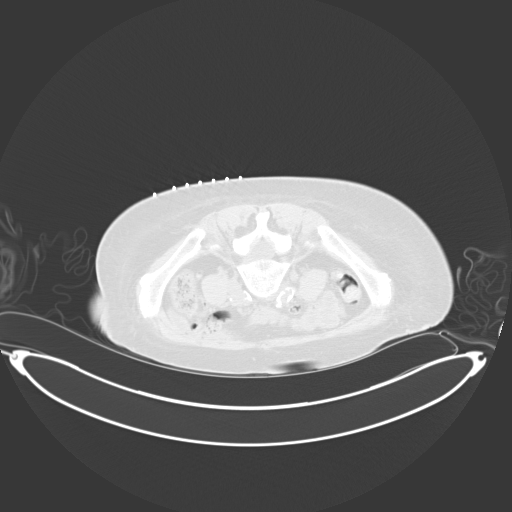
[im 24/25  lung]
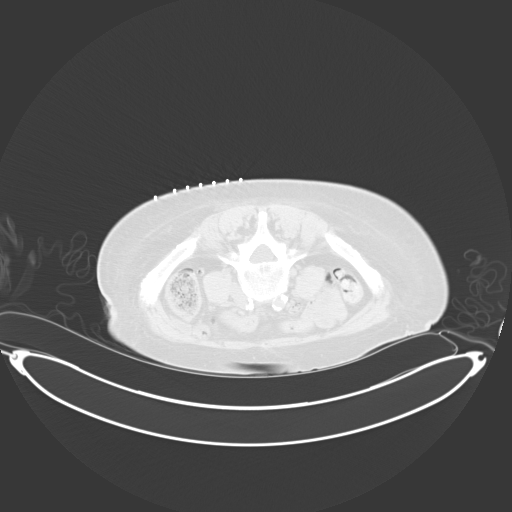

[15 of 28 positions shown; findings below may reference images not displayed]

EXAM:
CT GUIDED BONE MARROW ASPIRATION AND CORE BIOPSY

MEDICATIONS:
None.

ANESTHESIA/SEDATION:
Moderate (conscious) sedation was employed during this procedure. A
total of 1 milligrams versed and 50 micrograms fentanyl were
administered intravenously. The patient's level of consciousness and
vital signs were monitored continuously by radiology nursing
throughout the procedure under my direct supervision.

Total monitored sedation time: 10 minutes

FLUOROSCOPY TIME:  None

COMPLICATIONS:
None immediate.

Estimated blood loss: <25 mL

PROCEDURE:
Informed written consent was obtained from the patient after a
thorough discussion of the procedural risks, benefits and
alternatives. All questions were addressed. Maximal Sterile Barrier
Technique was utilized including caps, mask, sterile gowns, sterile
gloves, sterile drape, hand hygiene and skin antiseptic. A timeout
was performed prior to the initiation of the procedure.

The patient was positioned prone and non-contrast localization CT
was performed of the pelvis to demonstrate the iliac marrow spaces.

Maximal barrier sterile technique utilized including caps, mask,
sterile gowns, sterile gloves, large sterile drape, hand hygiene,
and betadine prep.

Under sterile conditions and local anesthesia, an 11 gauge coaxial
bone biopsy needle was advanced into the right iliac marrow space.
Needle position was confirmed with CT imaging. Initially, bone
marrow aspiration was performed. Next, the 11 gauge outer cannula
was utilized to obtain a right iliac bone marrow core biopsy. Needle
was removed. Hemostasis was obtained with compression. The patient
tolerated the procedure well. Samples were prepared with the
cytotechnologist.
IMPRESSION: Technically successful CT-guided right iliac bone marrow aspiration
and core biopsy.

## 2023-02-10 ENCOUNTER — Inpatient Hospital Stay: Payer: Medicare HMO

## 2023-02-10 ENCOUNTER — Inpatient Hospital Stay: Payer: Medicare HMO | Attending: Hematology

## 2023-02-10 VITALS — BP 162/88 | HR 66 | Temp 97.8°F

## 2023-02-10 DIAGNOSIS — D631 Anemia in chronic kidney disease: Secondary | ICD-10-CM | POA: Diagnosis present

## 2023-02-10 DIAGNOSIS — N1831 Chronic kidney disease, stage 3a: Secondary | ICD-10-CM | POA: Insufficient documentation

## 2023-02-10 DIAGNOSIS — D649 Anemia, unspecified: Secondary | ICD-10-CM

## 2023-02-10 LAB — CBC
HCT: 33.5 % — ABNORMAL LOW (ref 36.0–46.0)
Hemoglobin: 10.9 g/dL — ABNORMAL LOW (ref 12.0–15.0)
MCH: 28.9 pg (ref 26.0–34.0)
MCHC: 32.5 g/dL (ref 30.0–36.0)
MCV: 88.9 fL (ref 80.0–100.0)
Platelets: 147 10*3/uL — ABNORMAL LOW (ref 150–400)
RBC: 3.77 MIL/uL — ABNORMAL LOW (ref 3.87–5.11)
RDW: 15.5 % (ref 11.5–15.5)
WBC: 5.4 10*3/uL (ref 4.0–10.5)
nRBC: 0 % (ref 0.0–0.2)

## 2023-02-10 MED ORDER — EPOETIN ALFA 20000 UNIT/ML IJ SOLN
20000.0000 [IU] | Freq: Once | INTRAMUSCULAR | Status: AC
  Administered 2023-02-10: 20000 [IU] via SUBCUTANEOUS
  Filled 2023-02-10: qty 1

## 2023-02-10 NOTE — Patient Instructions (Signed)
[facility]  Discharge Instructions: Thank you for choosing [facility] to provide your oncology and hematology care.  If you have a lab appointment with the Cancer Center - please note that after April 8th, 2024, all labs will be drawn in the cancer center.  You do not have to check in or register with the main entrance as you have in the past but will complete your check-in in the cancer center.  Wear comfortable clothing and clothing appropriate for easy access to any Portacath or PICC line.   We strive to give you quality time with your provider. You may need to reschedule your appointment if you arrive late (15 or more minutes).  Arriving late affects you and other patients whose appointments are after yours.  Also, if you miss three or more appointments without notifying the office, you may be dismissed from the clinic at the provider's discretion.      For prescription refill requests, have your pharmacy contact our office and allow 72 hours for refills to be completed.    Today you received the following retacrit injection    To help prevent nausea and vomiting after your treatment, we encourage you to take your nausea medication as directed.  BELOW ARE SYMPTOMS THAT SHOULD BE REPORTED IMMEDIATELY: *FEVER GREATER THAN 100.4 F (38 C) OR HIGHER *CHILLS OR SWEATING *NAUSEA AND VOMITING THAT IS NOT CONTROLLED WITH YOUR NAUSEA MEDICATION *UNUSUAL SHORTNESS OF BREATH *UNUSUAL BRUISING OR BLEEDING *URINARY PROBLEMS (pain or burning when urinating, or frequent urination) *BOWEL PROBLEMS (unusual diarrhea, constipation, pain near the anus) TENDERNESS IN MOUTH AND THROAT WITH OR WITHOUT PRESENCE OF ULCERS (sore throat, sores in mouth, or a toothache) UNUSUAL RASH, SWELLING OR PAIN  UNUSUAL VAGINAL DISCHARGE OR ITCHING   Items with * indicate a potential emergency and should be followed up as soon as possible or go to the Emergency Department if any  problems should occur.  Please show the CHEMOTHERAPY ALERT CARD or IMMUNOTHERAPY ALERT CARD at check-in to the Emergency Department and triage nurse.  Should you have questions after your visit or need to cancel or reschedule your appointment, please contact [facility] [phone]  and follow the prompts.  Office hours are 8:00 a.m. to 4:30 p.m. Monday - Friday. Please note that voicemails left after 4:00 p.m. may not be returned until the following business day.  We are closed weekends and major holidays. You have access to a nurse at all times for urgent questions. Please call the main number to the clinic [phone] and follow the prompts.  For any non-urgent questions, you may also contact your provider using MyChart. We now offer e-Visits for anyone 75 and older to request care online for non-urgent symptoms. For details visit [email or web address].   Also download the MyChart app! Go to the app store, search "MyChart", open the app, select [facility], and log in with your MyChart username and password.

## 2023-02-10 NOTE — Progress Notes (Signed)
Retacrit injection given per orders. Patient tolerated it well without problems. Vitals stable and discharged home from clinic ambulatory. Follow up as scheduled.  

## 2023-03-23 ENCOUNTER — Telehealth: Payer: Self-pay

## 2023-03-23 NOTE — Telephone Encounter (Signed)
Patient called today for appointment clarification and informed me that she has added fortified iron tablets, collagen and a homemade drink consisting of tarte cherry juice/grape juice/beets/raisins/figs/apricots to her regular regimen.

## 2023-03-29 ENCOUNTER — Inpatient Hospital Stay: Payer: Medicare HMO | Admitting: Physician Assistant

## 2023-03-29 ENCOUNTER — Inpatient Hospital Stay: Payer: Medicare HMO

## 2023-03-29 ENCOUNTER — Inpatient Hospital Stay: Payer: Medicare HMO | Attending: Hematology

## 2023-03-29 VITALS — BP 131/78 | HR 60 | Temp 98.6°F | Resp 17 | Ht 61.0 in | Wt 131.7 lb

## 2023-03-29 DIAGNOSIS — D649 Anemia, unspecified: Secondary | ICD-10-CM | POA: Diagnosis not present

## 2023-03-29 DIAGNOSIS — D631 Anemia in chronic kidney disease: Secondary | ICD-10-CM | POA: Diagnosis present

## 2023-03-29 DIAGNOSIS — N1831 Chronic kidney disease, stage 3a: Secondary | ICD-10-CM

## 2023-03-29 DIAGNOSIS — D696 Thrombocytopenia, unspecified: Secondary | ICD-10-CM | POA: Diagnosis not present

## 2023-03-29 DIAGNOSIS — D72819 Decreased white blood cell count, unspecified: Secondary | ICD-10-CM | POA: Insufficient documentation

## 2023-03-29 DIAGNOSIS — D509 Iron deficiency anemia, unspecified: Secondary | ICD-10-CM | POA: Diagnosis not present

## 2023-03-29 DIAGNOSIS — Z79899 Other long term (current) drug therapy: Secondary | ICD-10-CM | POA: Diagnosis not present

## 2023-03-29 LAB — CBC
HCT: 35 % — ABNORMAL LOW (ref 36.0–46.0)
Hemoglobin: 11.3 g/dL — ABNORMAL LOW (ref 12.0–15.0)
MCH: 28.6 pg (ref 26.0–34.0)
MCHC: 32.3 g/dL (ref 30.0–36.0)
MCV: 88.6 fL (ref 80.0–100.0)
Platelets: 150 10*3/uL (ref 150–400)
RBC: 3.95 MIL/uL (ref 3.87–5.11)
RDW: 15.7 % — ABNORMAL HIGH (ref 11.5–15.5)
WBC: 5.6 10*3/uL (ref 4.0–10.5)
nRBC: 0 % (ref 0.0–0.2)

## 2023-03-29 NOTE — Patient Instructions (Signed)
[facility] Discharge Instructions  You were seen today by Rojelio Brenner PA-C for your anemia.  Your blood counts have improved.  We will continue your Retacrit injections 20,000 units every 6 weeks.  We will continue to have your blood checked on the same day as your Retacrit injections.  FOLLOW-UP APPOINTMENT: Office visit in 18 weeks   - - - - - - - - - - - - - - - - - -   The website for  "[facility] About Herbs" is an excellent resource on various herbal medications.  Although it does not include every herbal supplement available, it does include 250+ common supplements.  [email or web address].[email or web address]   - - - - - - - - - - - - - - - - - -   Thank you for choosing [facility] at [facility] to provide your oncology and hematology care.  To afford each patient quality time with our provider, please arrive at least 15 minutes before your scheduled appointment time.   If you have a lab appointment with the Cancer Center please come in thru the Main Entrance and check in at the main information desk.  You need to re-schedule your appointment should you arrive 10 or more minutes late.  We strive to give you quality time with our providers, and arriving late affects you and other patients whose appointments are after yours.  Also, if you no show three or more times for appointments you may be dismissed from the clinic at the providers discretion.     Again, thank you for choosing [facility].  Our hope is that these requests will decrease the amount of time that you wait before being seen by our physicians.       _____________________________________________________________  Should you have questions after your visit to [facility], please contact our office at [phone] and follow the prompts.  Our  office hours are 8:00 a.m. and 4:30 p.m. Monday - Friday.  Please note that voicemails left after 4:00 p.m. may not be returned until the following business day.  We are closed weekends and major holidays.  You do have access to a nurse 24-7, just call the main number to the clinic [phone] and do not press any options, hold on the line and a nurse will answer the phone.    For prescription refill requests, have your pharmacy contact our office and allow 72 hours.    Due to Covid, you will need to wear a mask upon entering the hospital. If you do not have a mask, a mask will be given to you at the Main Entrance upon arrival. For doctor visits, patients may have 1 support person age 66 or older with them. For treatment visits, patients can not have anyone with them due to social distancing guidelines and our immunocompromised population.

## 2023-03-29 NOTE — Progress Notes (Signed)
[facility] [address], [place] [address]   CLINIC:  Medical Oncology/Hematology  PCP:  No primary care provider on file. No primary provider on file. None   REASON FOR VISIT:  Follow-up for iron deficiency anemia and anemia of CKD   PRIOR THERAPY: None   CURRENT THERAPY: Intermittent IV iron infusions, Retacrit 20,000 units every 6 weeks  INTERVAL HISTORY:   Jennifer Li 72 y.o. female returns for routine follow-up of her anemia of CKD.  She was last seen by NP Durenda Hurt on 12/30/2022.  At today's visit, she reports feeling well.  She denies any abnormal fatigue, but does report that she has been feeling more cold lately.  She has occasional scant rectal bleeding with bowel movements, but denies any major blood loss such as hematochezia, melena, hematemesis, or epistaxis.  She denies any pica, restless legs, chest pain, dyspnea on exertion, lightheadedness, or syncope.  She does have occasional headaches.  She is tolerating Retacrit injections well and reports baseline blood pressure; no symptoms of DVT or PE.  She has 80% energy and 85% appetite. She endorses that she is maintaining a stable weight.  ASSESSMENT & PLAN:  1.  Anemia of CKD and iron deficiency - EGD and colonoscopy in March 2021 by Dr. Cheree Ditto showed mild gastritis, diverticulosis, internal and external hemorrhoids, 1 polyp which was noncancerous. - Capsule endoscopy in September 2021 - Bone marrow biopsy on 08/07/2020, normocellular marrow with trilineage hematopoiesis.  Mild polytypic plasmacytosis.  Chromosome analysis was normal.  SPEP was negative.  Free light chain ratio was normal with elevated kappa light chains consistent with CKD. - No prior history of blood transfusions.  No family history of sickle cell or thalassemia. - She received 1 g Venofer from 02/23/2021 - 03/09/2021 - She stopped iron tablet due to lack of improvement. - She denies any bleeding per rectum or melena.     -  She was started on Retacrit in December 2022, current dose is 20,000 units Q6 weeks - Labs today (03/29/2023) shows Hgb at goal 11.3.  Iron levels were normal when checked in August 2024. - PLAN: We will decrease frequency of Retacrit to 20,000 units every 6 weeks. - Labs and RTC in 18 weeks.  2.  Mild thrombocytopenia and leukopenia - Patient has had intermittent mild thrombocytopenia since March 2022.  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 - Bone marrow biopsy (08/07/2020): Normocellular marrow with trilineage hematopoiesis, as described above. - Lowest platelets 105 on 09/16/2021 - Nutritional panel (09/30/2021): Normal B12, folate, copper - She occasionally takes Red Clover and drinks dandelion tea.  She denies any quinine containing supplements or tonic water.   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  We will continue to monitor with CBCs as above.   If significant worsening or persistence of thrombocytopenia, would consider bone marrow biopsy.      3.  Stage IIIa/b CKD: - She follows with Dr. Ramiro Harvest in [place], [place]  PLAN SUMMARY: >> CBC + Retacrit every 6 weeks >> Same-day labs (CBC/D, CMP, ferritin, iron/TIBC) + injection + OFFICE visit in 18 weeks  REVIEW OF SYSTEMS:   Review of Systems  Constitutional:  Negative for appetite change, chills, diaphoresis, fatigue, fever and unexpected weight change.  HENT:   Negative for lump/mass and nosebleeds.   Eyes:  Negative for eye problems.  Respiratory:  Negative for cough, hemoptysis and shortness of breath.    Cardiovascular:  Negative for chest pain, leg swelling and palpitations.  Gastrointestinal:  Negative for abdominal pain, blood in stool, constipation, diarrhea, nausea and vomiting.  Genitourinary:  Negative for hematuria.   Skin: Negative.   Neurological:  Negative for dizziness, headaches and light-headedness.  Hematological:  Does not bruise/bleed easily.     PHYSICAL EXAM:  ECOG PERFORMANCE STATUS: 0 - Asymptomatic  Vitals:   03/29/23 1037  BP: 131/78  Pulse: 60  Resp: 17  Temp: 98.6 F (37 C)  SpO2: 100%   Filed Weights   03/29/23 1037  Weight: 131 lb 11.2 oz (59.7 kg)   Physical Exam Constitutional:      Appearance: Normal appearance. She is normal weight.  Cardiovascular:     Heart sounds: Normal heart sounds.  Pulmonary:     Breath sounds: Normal breath sounds.  Neurological:     General: No focal deficit present.     Mental Status: Mental status is at baseline.  Psychiatric:        Behavior: Behavior normal. Behavior is cooperative.    PAST MEDICAL/SURGICAL HISTORY:  Past Medical History:  Diagnosis Date   Arthritis    Cataract    Chronic kidney disease    stage 3   Heart murmur    Past Surgical History:  Procedure Laterality Date   ABDOMINAL HYSTERECTOMY     CATARACT EXTRACTION, BILATERAL     gaglion cyst removal     MYOMECTOMY      SOCIAL HISTORY:  Social History   Socioeconomic History   Marital status: Widowed    Spouse name: Not on file   Number of children: Not on file   Years of education: Not on file   Highest education level: Not on file  Occupational History   Not on file  Tobacco Use   Smoking status: Former   Smokeless tobacco: Never  Substance and Sexual Activity   Alcohol use: Never   Drug use: Never   Sexual activity: Not Currently  Other Topics Concern   Not on file  Social History Narrative   Not on file   Social Determinants of Health   Financial Resource Strain: Low Risk  (06/20/2020)   Overall Financial  Resource Strain (CARDIA)    Difficulty of Paying Living Expenses: Not hard at all  Food Insecurity: Food Insecurity Present (06/20/2020)   Hunger Vital Sign    Worried About Running Out of Food in the Last Year: Sometimes true    Ran Out of Food in the Last Year: Sometimes true  Transportation Needs: Unmet Transportation Needs (06/20/2020)   PRAPARE - Administrator, Civil Service (Medical): Yes    Lack of Transportation (Non-Medical): No  Physical Activity: Not on file  Stress: No Stress Concern Present (06/20/2020)   [employer] of Occupational Health - Occupational Stress Questionnaire    Feeling of Stress : Not at all  Social Connections: Moderately Integrated (06/20/2020)   Social Connection and Isolation Panel [NHANES]    Frequency of Communication with Friends and Family: More than three times a week    Frequency of Social Gatherings with Friends and Family: Once a week    Attends Religious  Services: More than 4 times per year    Active Member of Clubs or Organizations: Yes    Attends Banker Meetings: Never    Marital Status: Widowed  Intimate Partner Violence: Not At Risk (06/20/2020)   Humiliation, Afraid, Rape, and Kick questionnaire    Fear of Current or Ex-Partner: No    Emotionally Abused: No    Physically Abused: No    Sexually Abused: No    FAMILY HISTORY:  Family History  Problem Relation Age of Onset   Diabetes Sister    Diabetes Son     CURRENT MEDICATIONS:  Outpatient Encounter Medications as of 03/29/2023  Medication Sig   amLODipine (NORVASC) 10 MG tablet Take 10 mg by mouth daily.   ascorbic acid (VITAMIN C) 1000 MG tablet Vitamin C 1,000 mg tablet  Take 1 tablet every day by oral route.   B Complex-C (B-COMPLEX WITH VITAMIN C) tablet Take by mouth.   cholecalciferol (VITAMIN D3) 25 MCG (1000 UNIT) tablet Take 1,000 Units by mouth daily.   cyanocobalamin (VITAMIN B12) 1000 MCG tablet Take 1,000 mcg by mouth daily.    losartan (COZAAR) 100 MG tablet losartan 100 mg tablet   Magnesium 250 MG TABS Take by mouth.   Metoprolol Tartrate 37.5 MG TABS Take 37.5 mg by mouth 2 (two) times daily.   Misc Natural Products (WHITE WILLOW BARK PO) Take by mouth.   niacin (VITAMIN B3) 500 MG ER tablet TAKE 1 TABLET BY MOUTH EVERY DAY AT BEDTIME FOR 90 DAYS   nitroGLYCERIN (NITROSTAT) 0.4 MG SL tablet    RESTASIS 0.05 % ophthalmic emulsion SMARTSIG:1 Drop(s) In Eye(s) Every 12 Hours   spironolactone (ALDACTONE) 25 MG tablet spironolactone 25 mg tablet   triamcinolone ointment (KENALOG) 0.1 % Apply topically 2 (two) times daily.   valACYclovir (VALTREX) 500 MG tablet Take 1,000 mg by mouth as needed.   No facility-administered encounter medications on file as of 03/29/2023.    ALLERGIES:  Allergies  Allergen Reactions   Escitalopram    Gemfibrozil Hives and Other (See Comments)   Sulfa Antibiotics Hives   Sulfur    Trazodone    Valsartan Other (See Comments)    Patient says that she does not have allergy to this    LABORATORY DATA:  I have reviewed the labs as listed.  CBC    Component Value Date/Time   WBC 5.6 03/29/2023 0902   RBC 3.95 03/29/2023 0902   HGB 11.3 (L) 03/29/2023 0902   HCT 35.0 (L) 03/29/2023 0902   HCT 29.8 (L) 06/20/2020 1315   PLT 150 03/29/2023 0902   MCV 88.6 03/29/2023 0902   MCH 28.6 03/29/2023 0902   MCHC 32.3 03/29/2023 0902   RDW 15.7 (H) 03/29/2023 0902   LYMPHSABS 1.5 12/30/2022 1029   MONOABS 0.3 12/30/2022 1029   EOSABS 0.1 12/30/2022 1029   BASOSABS 0.0 12/30/2022 1029      Latest Ref Rng & Units 12/30/2022   10:29 AM 11/18/2022   11:10 AM 08/26/2022    9:26 AM  CMP  Glucose 70 - 99 mg/dL 161  98  94   BUN 8 - 23 mg/dL 24  26  23    Creatinine 0.44 - 1.00 mg/dL 0.96  0.45  4.09   Sodium 135 - 145 mmol/L 140  138  140   Potassium 3.5 - 5.1 mmol/L 4.0  4.5  4.5   Chloride 98 - 111 mmol/L 108  107  107   CO2 22 -  32 mmol/L 26  21  25    Calcium 8.9 - 10.3 mg/dL 9.4   9.8  9.6   Total Protein 6.5 - 8.1 g/dL 7.1   6.8   Total Bilirubin 0.3 - 1.2 mg/dL 1.0   1.0   Alkaline Phos 38 - 126 U/L 36   36   AST 15 - 41 U/L 21   18   ALT 0 - 44 U/L 16   17     DIAGNOSTIC IMAGING:  I have independently reviewed the relevant imaging and discussed with the patient.   WRAP UP:  All questions were answered. The patient knows to call the clinic with any problems, questions or concerns.  Medical decision making: Moderate  Time spent on visit: I spent 20 minutes counseling the patient face to face. The total time spent in the appointment was 30 minutes and more than 50% was on counseling.  Carnella Guadalajara, PA-C  03/29/2023 11:59 AM

## 2023-03-29 NOTE — Progress Notes (Signed)
HGB 11.3. No Epogen needed today. Patient being seen by R.Pennington PA. Vital signs stable. Blood pressure 131/78. Message sent to R. Pennington PA and reported HGB 11.3.

## 2023-05-10 ENCOUNTER — Inpatient Hospital Stay: Payer: Medicare HMO | Attending: Hematology

## 2023-05-10 ENCOUNTER — Inpatient Hospital Stay: Payer: Medicare HMO

## 2023-05-10 VITALS — BP 126/79 | HR 66 | Temp 97.6°F | Resp 18

## 2023-05-10 DIAGNOSIS — N1831 Chronic kidney disease, stage 3a: Secondary | ICD-10-CM

## 2023-05-10 DIAGNOSIS — D631 Anemia in chronic kidney disease: Secondary | ICD-10-CM | POA: Insufficient documentation

## 2023-05-10 DIAGNOSIS — D649 Anemia, unspecified: Secondary | ICD-10-CM

## 2023-05-10 LAB — CBC
HCT: 30.9 % — ABNORMAL LOW (ref 36.0–46.0)
Hemoglobin: 10 g/dL — ABNORMAL LOW (ref 12.0–15.0)
MCH: 29 pg (ref 26.0–34.0)
MCHC: 32.4 g/dL (ref 30.0–36.0)
MCV: 89.6 fL (ref 80.0–100.0)
Platelets: 201 10*3/uL (ref 150–400)
RBC: 3.45 MIL/uL — ABNORMAL LOW (ref 3.87–5.11)
RDW: 15.9 % — ABNORMAL HIGH (ref 11.5–15.5)
WBC: 4.9 10*3/uL (ref 4.0–10.5)
nRBC: 0 % (ref 0.0–0.2)

## 2023-05-10 MED ORDER — EPOETIN ALFA 20000 UNIT/ML IJ SOLN
20000.0000 [IU] | Freq: Once | INTRAMUSCULAR | Status: AC
  Administered 2023-05-10: 20000 [IU] via SUBCUTANEOUS
  Filled 2023-05-10: qty 1

## 2023-05-10 NOTE — Progress Notes (Signed)
Patient tolerated injection with no complaints voiced.  Site clean and dry with no bruising or swelling noted at site.  See MAR for details.  Band aid applied.  Patient stable during and after injection.  Vss with discharge and left in satisfactory condition with no s/s of distress noted.  

## 2023-05-10 NOTE — Patient Instructions (Signed)

## 2023-05-12 NOTE — Addendum Note (Signed)
Addended by: Cynda Acres A on: 05/12/2023 08:17 AM   Modules accepted: Orders

## 2023-05-12 NOTE — Addendum Note (Signed)
Addended by: Cynda Acres A on: 05/12/2023 08:28 AM   Modules accepted: Orders

## 2023-06-21 ENCOUNTER — Inpatient Hospital Stay: Payer: Medicare HMO

## 2023-06-21 ENCOUNTER — Inpatient Hospital Stay: Payer: Medicare HMO | Attending: Hematology

## 2023-06-21 VITALS — BP 133/76 | HR 71 | Temp 98.4°F | Resp 19

## 2023-06-21 DIAGNOSIS — N1832 Chronic kidney disease, stage 3b: Secondary | ICD-10-CM | POA: Diagnosis present

## 2023-06-21 DIAGNOSIS — D631 Anemia in chronic kidney disease: Secondary | ICD-10-CM | POA: Diagnosis present

## 2023-06-21 DIAGNOSIS — D649 Anemia, unspecified: Secondary | ICD-10-CM

## 2023-06-21 DIAGNOSIS — N1831 Chronic kidney disease, stage 3a: Secondary | ICD-10-CM

## 2023-06-21 LAB — CBC
HCT: 33.3 % — ABNORMAL LOW (ref 36.0–46.0)
Hemoglobin: 10.6 g/dL — ABNORMAL LOW (ref 12.0–15.0)
MCH: 29 pg (ref 26.0–34.0)
MCHC: 31.8 g/dL (ref 30.0–36.0)
MCV: 91 fL (ref 80.0–100.0)
Platelets: 137 10*3/uL — ABNORMAL LOW (ref 150–400)
RBC: 3.66 MIL/uL — ABNORMAL LOW (ref 3.87–5.11)
RDW: 16.5 % — ABNORMAL HIGH (ref 11.5–15.5)
WBC: 6.1 10*3/uL (ref 4.0–10.5)
nRBC: 0 % (ref 0.0–0.2)

## 2023-06-21 MED ORDER — EPOETIN ALFA 20000 UNIT/ML IJ SOLN
20000.0000 [IU] | Freq: Once | INTRAMUSCULAR | Status: AC
Start: 2023-06-21 — End: 2023-06-21
  Administered 2023-06-21: 20000 [IU] via SUBCUTANEOUS
  Filled 2023-06-21: qty 1

## 2023-06-21 NOTE — Progress Notes (Signed)
Hemoglobin today is 10.6.  We will proceed with Retacrit injection per provider orders.   Patient tolerated Retacrit injection with no complaints voiced.  Site clean and dry with no bruising or swelling noted.  No complaints of pain.  Discharged with vital signs stable and no signs or symptoms of distress noted.

## 2023-06-21 NOTE — Patient Instructions (Signed)
 [facility] - A DEPT OF [facility]  Discharge Instructions: Thank you for choosing [facility] to provide your oncology and hematology care.  If you have a lab appointment with the Cancer Center - please note that after April 8th, 2024, all labs will be drawn in the cancer center.  You do not have to check in or register with the main entrance as you have in the past but will complete your check-in in the cancer center.  Wear comfortable clothing and clothing appropriate for easy access to any Portacath or PICC line.   We strive to give you quality time with your provider. You may need to reschedule your appointment if you arrive late (15 or more minutes).  Arriving late affects you and other patients whose appointments are after yours.  Also, if you miss three or more appointments without notifying the office, you may be dismissed from the clinic at the provider's discretion.      For prescription refill requests, have your pharmacy contact our office and allow 72 hours for refills to be completed.    Today you received the following:  Retacrit.  Epoetin Alfa Injection What is this medication? EPOETIN ALFA (e POE e tin AL fa) treats low levels of red blood cells (anemia) caused by kidney disease, chemotherapy, or HIV medications. It can also be used in people who are at risk for blood loss during surgery. It works by Systems analyst make more red blood cells, which reduces the need for blood transfusions. This medicine may be used for other purposes; ask your health care provider or pharmacist if you have questions. COMMON BRAND NAME(S): Epogen, Procrit, Retacrit What should I tell my care team before I take this medication? They need to know if you have any of these conditions: Blood clots Cancer Heart disease High blood pressure On dialysis Seizures Stroke An unusual or allergic reaction to epoetin alfa, albumin, benzyl alcohol, other  medications, foods, dyes, or preservatives Pregnant or trying to get pregnant Breast-feeding How should I use this medication? This medication is injected into a vein or under the skin. It is usually given by your care team in a hospital or clinic setting. It may also be given at home. If you get this medication at home, you will be taught how to prepare and give it. Use exactly as directed. Take it as directed on the prescription label at the same time every day. Keep taking it unless your care team tells you to stop. It is important that you put your used needles and syringes in a special sharps container. Do not put them in a trash can. If you do not have a sharps container, call your pharmacist or care team to get one. A special MedGuide will be given to you by the pharmacist with each prescription and refill. Be sure to read this information carefully each time. Talk to your care team about the use of this medication in children. While this medication may be used in children as young as 1 month of age for selected conditions, precautions do apply. Overdosage: If you think you have taken too much of this medicine contact a poison control center or emergency room at once. NOTE: This medicine is only for you. Do not share this medicine with others. What if I miss a dose? If you miss a dose, take it as soon as you can. If it is almost time for your next  dose, take only that dose. Do not take double or extra doses. What may interact with this medication? Darbepoetin alfa Methoxy polyethylene glycol-epoetin beta This list may not describe all possible interactions. Give your health care provider a list of all the medicines, herbs, non-prescription drugs, or dietary supplements you use. Also tell them if you smoke, drink alcohol, or use illegal drugs. Some items may interact with your medicine. What should I watch for while using this medication? Visit your care team for regular checks on your  progress. Check your blood pressure as directed. Know what your blood pressure should be and when to contact your care team. Your condition will be monitored carefully while you are receiving this medication. You may need blood work while taking this medication. What side effects may I notice from receiving this medication? Side effects that you should report to your care team as soon as possible: Allergic reactions--skin rash, itching, hives, swelling of the face, lips, tongue, or throat Blood clot--pain, swelling, or warmth in the leg, shortness of breath, chest pain Heart attack--pain or tightness in the chest, shoulders, arms, or jaw, nausea, shortness of breath, cold or clammy skin, feeling faint or lightheaded Increase in blood pressure Rash, fever, and swollen lymph nodes Redness, blistering, peeling, or loosening of the skin, including inside the mouth Seizures Stroke--sudden numbness or weakness of the face, arm, or leg, trouble speaking, confusion, trouble walking, loss of balance or coordination, dizziness, severe headache, change in vision Side effects that usually do not require medical attention (report to your care team if they continue or are bothersome): Bone, joint, or muscle pain Cough Headache Nausea Pain, redness, or irritation at injection site This list may not describe all possible side effects. Call your doctor for medical advice about side effects. You may report side effects to FDA at 1-800-FDA-1088. Where should I keep my medication? Keep out of the reach of children and pets. Store in a refrigerator. Do not freeze. Do not shake. Protect from light. Keep this medication in the original container until you are ready to take it. See product for storage information. Get rid of any unused medication after the expiration date. To get rid of medications that are no longer needed or have expired: Take the medication to a medication take-back program. Check with your  pharmacy or law enforcement to find a location. If you cannot return the medication, ask your pharmacist or care team how to get rid of the medication safely. NOTE: This sheet is a summary. It may not cover all possible information. If you have questions about this medicine, talk to your doctor, pharmacist, or health care provider.  2024 Elsevier/Gold Standard (2021-09-18 00:00:00)     To help prevent nausea and vomiting after your treatment, we encourage you to take your nausea medication as directed.  BELOW ARE SYMPTOMS THAT SHOULD BE REPORTED IMMEDIATELY: *FEVER GREATER THAN 100.4 F (38 C) OR HIGHER *CHILLS OR SWEATING *NAUSEA AND VOMITING THAT IS NOT CONTROLLED WITH YOUR NAUSEA MEDICATION *UNUSUAL SHORTNESS OF BREATH *UNUSUAL BRUISING OR BLEEDING *URINARY PROBLEMS (pain or burning when urinating, or frequent urination) *BOWEL PROBLEMS (unusual diarrhea, constipation, pain near the anus) TENDERNESS IN MOUTH AND THROAT WITH OR WITHOUT PRESENCE OF ULCERS (sore throat, sores in mouth, or a toothache) UNUSUAL RASH, SWELLING OR PAIN  UNUSUAL VAGINAL DISCHARGE OR ITCHING   Items with * indicate a potential emergency and should be followed up as soon as possible or go to the Emergency Department if any problems should  occur.  Please show the CHEMOTHERAPY ALERT CARD or IMMUNOTHERAPY ALERT CARD at check-in to the Emergency Department and triage nurse.  Should you have questions after your visit or need to cancel or reschedule your appointment, please contact [facility] [place] - A DEPT OF [facility] [phone]  and follow the prompts.  Office hours are 8:00 a.m. to 4:30 p.m. Monday - Friday. Please note that voicemails left after 4:00 p.m. may not be returned until the following business day.  We are closed weekends and major holidays. You have access to a nurse at all times for urgent questions. Please call the main number to the clinic [phone] and follow the  prompts.  For any non-urgent questions, you may also contact your provider using MyChart. We now offer e-Visits for anyone 42 and older to request care online for non-urgent symptoms. For details visit [email or web address].   Also download the MyChart app! Go to the app store, search "MyChart", open the app, select [place], and log in with your MyChart username and password.

## 2023-07-26 ENCOUNTER — Other Ambulatory Visit: Payer: Self-pay | Admitting: *Deleted

## 2023-07-26 DIAGNOSIS — D649 Anemia, unspecified: Secondary | ICD-10-CM

## 2023-07-26 DIAGNOSIS — D631 Anemia in chronic kidney disease: Secondary | ICD-10-CM

## 2023-08-01 NOTE — Progress Notes (Unsigned)
 Jennifer Li [address], [place] [address]   CLINIC:  Medical Oncology/Hematology  PCP:  Jennifer Hy, Jennifer Li [address] [place] [address] [phone]   REASON FOR VISIT:  Follow-up for iron deficiency anemia and anemia of CKD   PRIOR THERAPY: None   CURRENT THERAPY: Intermittent IV iron infusions, Retacrit 20,000 units every 6 weeks  INTERVAL HISTORY:   Jennifer Li 73 y.o. Li returns for routine follow-up of her anemia of CKD.  She was last seen by Rojelio Brenner PA-C on 03/29/2023.  At today's visit, she reports feeling well.  She denies any abnormal fatigue, but does report that she has been feeling more cold lately.  She has occasional scant rectal bleeding with bowel movements, but denies any major blood loss such as hematochezia, melena, hematemesis, or epistaxis.  She denies any pica, restless legs, chest pain, dyspnea on exertion, lightheadedness, or syncope.   She does have occasional headaches.  She is tolerating Retacrit injections well and reports baseline blood pressure; no symptoms of DVT or PE.  She has 75% energy and 100% appetite. She endorses that she is maintaining a stable weight.  ASSESSMENT & PLAN:  1.  Anemia of CKD and iron deficiency - EGD and colonoscopy in March 2021 by Dr. Cheree Ditto showed mild gastritis, diverticulosis, internal and external hemorrhoids, 1 polyp which was noncancerous. - Capsule endoscopy in September 2021 - Bone marrow biopsy on 08/07/2020, normocellular marrow with trilineage hematopoiesis.  Mild polytypic plasmacytosis.  Chromosome analysis was normal.  SPEP was negative.  Free light chain ratio was normal with elevated kappa light chains consistent with CKD. - No prior history of blood transfusions.  No family history of sickle cell or thalassemia. - She received 1 g Venofer from 02/23/2021 - 03/09/2021 - She stopped iron tablet due to lack of improvement. - She denies any bleeding per rectum or  melena.     - She was started on Retacrit in December 2022, current dose is 20,000 units Q6 weeks - Labs today (08/02/2023) shows Hgb at goal (11.0).  Iron levels at goal (ferritin 290, iron saturation 20%).  Baseline CKD (creatinine 1.31/GFR 42) - PLAN: Continue Retacrit 20,000 units every 6 weeks. - Labs and RTC in 18 weeks.  2.  Mild thrombocytopenia and leukopenia - Patient has had intermittent mild thrombocytopenia since March 2022.  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 - Bone marrow biopsy (08/07/2020): Normocellular marrow with trilineage hematopoiesis, as described above. - Lowest platelets 105 on 09/16/2021 - Nutritional panel (09/30/2021): Normal B12, folate, copper - She occasionally takes Red Clover and drinks dandelion tea.  She denies any quinine containing supplements or tonic water.   - No known history of liver disease, autoimmune disorder, or connective tissue disorder  - She denies any bleeding, bruising, petechial rash    - Most recent CBC (08/02/2023) shows normal platelets (173), normal WBC/differential - Differential diagnosis includes possible mild chronic ITP versus thrombocytopenic effect of herbal medication - PLAN: No indication for treatment at this time.  We will continue to monitor with CBCs as above.   If significant worsening or persistence of thrombocytopenia, would consider bone marrow biopsy.      3.  Stage IIIa/b CKD: - She follows with Dr. Ramiro Harvest in [place], [place]  PLAN SUMMARY:  >> CBC + Retacrit every 6 weeks >> Same-day labs (CBC/D, CMP, ferritin, iron/TIBC) + injection + OFFICE visit  in 18 weeks (or phone visit after labs/injections)     REVIEW OF SYSTEMS:   Review of Systems  Constitutional:  Negative for appetite change, chills, diaphoresis, fatigue, fever and unexpected weight change.  HENT:   Negative for lump/mass and nosebleeds.   Eyes:  Negative  for eye problems.  Respiratory:  Negative for cough, hemoptysis and shortness of breath.   Cardiovascular:  Positive for palpitations. Negative for chest pain and leg swelling.  Gastrointestinal:  Negative for abdominal pain, blood in stool, constipation, diarrhea, nausea and vomiting.  Genitourinary:  Negative for hematuria.   Musculoskeletal:  Positive for arthralgias (hands and wrists).  Skin: Negative.   Neurological:  Negative for dizziness, headaches and light-headedness.  Hematological:  Does not bruise/bleed easily.     PHYSICAL EXAM:  ECOG PERFORMANCE STATUS: 0 - Asymptomatic  Vitals:   08/02/23 0958  BP: (!) 158/91  Pulse: 66  Resp: 18  Temp: 98 F (36.7 C)  SpO2: 100%    Filed Weights   08/02/23 0958  Weight: 133 lb (60.3 kg)    Physical Exam Constitutional:      Appearance: Normal appearance. She is normal weight.  Cardiovascular:     Heart sounds: Normal heart sounds.  Pulmonary:     Breath sounds: Normal breath sounds.  Neurological:     General: No focal deficit present.     Mental Status: Mental status is at baseline.  Psychiatric:        Behavior: Behavior normal. Behavior is cooperative.    PAST MEDICAL/SURGICAL HISTORY:  Past Medical History:  Diagnosis Date   Arthritis    Cataract    Chronic kidney disease    stage 3   Heart murmur    Past Surgical History:  Procedure Laterality Date   ABDOMINAL HYSTERECTOMY     CATARACT EXTRACTION, BILATERAL     gaglion cyst removal     MYOMECTOMY      SOCIAL HISTORY:  Social History   Socioeconomic History   Marital status: Widowed    Spouse name: Not on file   Number of children: Not on file   Years of education: Not on file   Highest education level: Not on file  Occupational History   Not on file  Tobacco Use   Smoking status: Former   Smokeless tobacco: Never  Substance and Sexual Activity   Alcohol use: Never   Drug use: Never   Sexual activity: Not Currently  Other Topics  Concern   Not on file  Social History Narrative   Not on file   Social Drivers of Health   Financial Resource Strain: Low Risk  (06/20/2020)   Overall Financial Resource Strain (CARDIA)    Difficulty of Paying Living Expenses: Not hard at all  Food Insecurity: Food Insecurity Present (06/20/2020)   Hunger Vital Sign    Worried About Running Out of Food in the Last Year: Sometimes true    Ran Out of Food in the Last Year: Sometimes true  Transportation Needs: Unmet Transportation Needs (06/20/2020)   PRAPARE - Administrator, Civil Service (Medical): Yes    Lack of Transportation (Non-Medical): No  Physical Activity: Not on file  Stress: No Stress Concern Present (06/20/2020)   [employer] of Occupational Health - Occupational Stress Questionnaire    Feeling of Stress : Not at all  Social Connections: Moderately Integrated (06/20/2020)   Social Connection and Isolation Panel [NHANES]    Frequency of Communication with Friends and Family: More  than three times a week    Frequency of Social Gatherings with Friends and Family: Once a week    Attends Religious Services: More than 4 times per year    Active Member of [employer] or Organizations: Yes    Attends Banker Meetings: Never    Marital Status: Widowed  Intimate Partner Violence: Not At Risk (06/20/2020)   Humiliation, Afraid, Rape, and Kick questionnaire    Fear of Current or Ex-Partner: No    Emotionally Abused: No    Physically Abused: No    Sexually Abused: No    FAMILY HISTORY:  Family History  Problem Relation Age of Onset   Diabetes Sister    Diabetes Son     CURRENT MEDICATIONS:  Outpatient Encounter Medications as of 08/02/2023  Medication Sig   amLODipine (NORVASC) 10 MG tablet Take 10 mg by mouth daily.   ascorbic acid (VITAMIN C) 1000 MG tablet Vitamin C 1,000 mg tablet  Take 1 tablet every day by oral route.   B Complex-C (B-COMPLEX WITH VITAMIN C) tablet Take by mouth.    cholecalciferol (VITAMIN D3) 25 MCG (1000 UNIT) tablet Take 1,000 Units by mouth daily.   cyanocobalamin (VITAMIN B12) 1000 MCG tablet Take 1,000 mcg by mouth daily.   losartan (COZAAR) 100 MG tablet losartan 100 mg tablet   Magnesium 250 MG TABS Take by mouth.   Metoprolol Tartrate 37.5 MG TABS Take 37.5 mg by mouth 2 (two) times daily.   Misc Natural Products (WHITE WILLOW BARK PO) Take by mouth.   niacin (VITAMIN B3) 500 MG ER tablet TAKE 1 TABLET BY MOUTH EVERY DAY AT BEDTIME FOR 90 DAYS   nitroGLYCERIN (NITROSTAT) 0.4 MG SL tablet    RESTASIS 0.05 % ophthalmic emulsion SMARTSIG:1 Drop(s) In Eye(s) Every 12 Hours   spironolactone (ALDACTONE) 25 MG tablet spironolactone 25 mg tablet   triamcinolone ointment (KENALOG) 0.1 % Apply topically 2 (two) times daily.   valACYclovir (VALTREX) 500 MG tablet Take 1,000 mg by mouth as needed.   No facility-administered encounter medications on file as of 08/02/2023.    ALLERGIES:  Allergies  Allergen Reactions   Escitalopram    Gemfibrozil Hives and Other (See Comments)   Sulfa Antibiotics Hives   Sulfur    Trazodone    Valsartan Other (See Comments)    Patient says that she does not have allergy to this    LABORATORY DATA:  I have reviewed the labs as listed.  CBC    Component Value Date/Time   WBC 5.0 08/02/2023 0820   RBC 3.87 08/02/2023 0820   HGB 11.0 (L) 08/02/2023 0820   HCT 34.4 (L) 08/02/2023 0820   HCT 29.8 (L) 06/20/2020 1315   PLT 173 08/02/2023 0820   MCV 88.9 08/02/2023 0820   MCH 28.4 08/02/2023 0820   MCHC 32.0 08/02/2023 0820   RDW 15.4 08/02/2023 0820   LYMPHSABS 2.0 08/02/2023 0820   MONOABS 0.4 08/02/2023 0820   EOSABS 0.1 08/02/2023 0820   BASOSABS 0.0 08/02/2023 0820      Latest Ref Rng & Units 08/02/2023    8:20 AM 12/30/2022   10:29 AM 11/18/2022   11:10 AM  CMP  Glucose 70 - 99 mg/dL 93  213  98   BUN 8 - 23 mg/dL 20  24  26    Creatinine 0.44 - 1.00 mg/dL 0.86  5.78  4.69   Sodium 135 - 145 mmol/L 140   140  138   Potassium 3.5 -  5.1 mmol/L 4.5  4.0  4.5   Chloride 98 - 111 mmol/L 107  108  107   CO2 22 - 32 mmol/L 23  26  21    Calcium 8.9 - 10.3 mg/dL 9.9  9.4  9.8   Total Protein 6.5 - 8.1 g/dL 7.3  7.1    Total Bilirubin 0.0 - 1.2 mg/dL 0.8  1.0    Alkaline Phos 38 - 126 U/L 40  36    AST 15 - 41 U/L 23  21    ALT 0 - 44 U/L 15  16      DIAGNOSTIC IMAGING:  I have independently reviewed the relevant imaging and discussed with the patient.   WRAP UP:  All questions were answered. The patient knows to call the clinic with any problems, questions or concerns.  Medical decision making: Moderate  Time spent on visit: I spent 20 minutes counseling the patient face to face. The total time spent in the appointment was 30 minutes and more than 50% was on counseling.  Jennifer Guadalajara, PA-C  08/02/23 10:34 AM

## 2023-08-02 ENCOUNTER — Inpatient Hospital Stay: Payer: Medicare HMO

## 2023-08-02 ENCOUNTER — Inpatient Hospital Stay: Payer: Medicare HMO | Attending: Hematology | Admitting: Physician Assistant

## 2023-08-02 VITALS — BP 158/91 | HR 66 | Temp 98.0°F | Resp 18 | Wt 133.0 lb

## 2023-08-02 DIAGNOSIS — D696 Thrombocytopenia, unspecified: Secondary | ICD-10-CM

## 2023-08-02 DIAGNOSIS — N1831 Chronic kidney disease, stage 3a: Secondary | ICD-10-CM

## 2023-08-02 DIAGNOSIS — D649 Anemia, unspecified: Secondary | ICD-10-CM | POA: Diagnosis not present

## 2023-08-02 DIAGNOSIS — D509 Iron deficiency anemia, unspecified: Secondary | ICD-10-CM | POA: Insufficient documentation

## 2023-08-02 DIAGNOSIS — D631 Anemia in chronic kidney disease: Secondary | ICD-10-CM | POA: Diagnosis not present

## 2023-08-02 DIAGNOSIS — N1832 Chronic kidney disease, stage 3b: Secondary | ICD-10-CM | POA: Insufficient documentation

## 2023-08-02 DIAGNOSIS — D72819 Decreased white blood cell count, unspecified: Secondary | ICD-10-CM | POA: Insufficient documentation

## 2023-08-02 DIAGNOSIS — Z79899 Other long term (current) drug therapy: Secondary | ICD-10-CM | POA: Insufficient documentation

## 2023-08-02 LAB — COMPREHENSIVE METABOLIC PANEL
ALT: 15 U/L (ref 0–44)
AST: 23 U/L (ref 15–41)
Albumin: 4.2 g/dL (ref 3.5–5.0)
Alkaline Phosphatase: 40 U/L (ref 38–126)
Anion gap: 10 (ref 5–15)
BUN: 20 mg/dL (ref 8–23)
CO2: 23 mmol/L (ref 22–32)
Calcium: 9.9 mg/dL (ref 8.9–10.3)
Chloride: 107 mmol/L (ref 98–111)
Creatinine, Ser: 1.31 mg/dL — ABNORMAL HIGH (ref 0.44–1.00)
GFR, Estimated: 42 mL/min — ABNORMAL LOW (ref >60)
Glucose, Bld: 93 mg/dL (ref 70–99)
Potassium: 4.5 mmol/L (ref 3.5–5.1)
Sodium: 140 mmol/L (ref 135–145)
Total Bilirubin: 0.8 mg/dL (ref 0.0–1.2)
Total Protein: 7.3 g/dL (ref 6.5–8.1)

## 2023-08-02 LAB — CBC WITH DIFFERENTIAL/PLATELET
Abs Immature Granulocytes: 0.01 10*3/uL (ref 0.00–0.07)
Basophils Absolute: 0 10*3/uL (ref 0.0–0.1)
Basophils Relative: 1 %
Eosinophils Absolute: 0.1 10*3/uL (ref 0.0–0.5)
Eosinophils Relative: 3 %
HCT: 34.4 % — ABNORMAL LOW (ref 36.0–46.0)
Hemoglobin: 11 g/dL — ABNORMAL LOW (ref 12.0–15.0)
Immature Granulocytes: 0 %
Lymphocytes Relative: 40 %
Lymphs Abs: 2 10*3/uL (ref 0.7–4.0)
MCH: 28.4 pg (ref 26.0–34.0)
MCHC: 32 g/dL (ref 30.0–36.0)
MCV: 88.9 fL (ref 80.0–100.0)
Monocytes Absolute: 0.4 10*3/uL (ref 0.1–1.0)
Monocytes Relative: 7 %
Neutro Abs: 2.5 10*3/uL (ref 1.7–7.7)
Neutrophils Relative %: 49 %
Platelets: 173 10*3/uL (ref 150–400)
RBC: 3.87 MIL/uL (ref 3.87–5.11)
RDW: 15.4 % (ref 11.5–15.5)
WBC: 5 10*3/uL (ref 4.0–10.5)
nRBC: 0 % (ref 0.0–0.2)

## 2023-08-02 LAB — VITAMIN D 25 HYDROXY (VIT D DEFICIENCY, FRACTURES): Vit D, 25-Hydroxy: 26.03 ng/mL — ABNORMAL LOW (ref 30–100)

## 2023-08-02 LAB — IRON AND TIBC
Iron: 65 ug/dL (ref 28–170)
Saturation Ratios: 20 % (ref 10.4–31.8)
TIBC: 326 ug/dL (ref 250–450)
UIBC: 261 ug/dL

## 2023-08-02 LAB — FERRITIN: Ferritin: 296 ng/mL (ref 11–307)

## 2023-08-02 NOTE — Patient Instructions (Addendum)
 [facility] at [facility] Discharge Instructions  You were seen today by Rojelio Brenner PA-C for your anemia.  Your blood counts have improved.  We will continue your Retacrit injections 20,000 units every 6 weeks.  We will continue to have your blood checked on the same day as your Retacrit injections.  FOLLOW-UP APPOINTMENT: Follow-up visit in 18 weeks   - - - - - - - - - - - - - - - - - -   The website for  "[facility] About Herbs" is an excellent resource on various herbal medications.  Although it does not include every herbal supplement available, it does include 250+ common supplements.  [email or web address].[email or web address]   - - - - - - - - - - - - - - - - - -   Thank you for choosing [facility] at [facility] to provide your oncology and hematology care.  To afford each patient quality time with our provider, please arrive at least 15 minutes before your scheduled appointment time.   If you have a lab appointment with the Cancer Center please come in thru the Main Entrance and check in at the main information desk.  You need to re-schedule your appointment should you arrive 10 or more minutes late.  We strive to give you quality time with our providers, and arriving late affects you and other patients whose appointments are after yours.  Also, if you no show three or more times for appointments you may be dismissed from the clinic at the providers discretion.     Again, thank you for choosing [facility].  Our hope is that these requests will decrease the amount of time that you wait before being seen by our physicians.       _____________________________________________________________  Should you have questions after your visit to [facility], please contact our office at [phone] and follow the prompts.   Our office hours are 8:00 a.m. and 4:30 p.m. Monday - Friday.  Please note that voicemails left after 4:00 p.m. may not be returned until the following business day.  We are closed weekends and major holidays.  You do have access to a nurse 24-7, just call the main number to the clinic [phone] and do not press any options, hold on the line and a nurse will answer the phone.    For prescription refill requests, have your pharmacy contact our office and allow 72 hours.    Due to Covid, you will need to wear a mask upon entering the hospital. If you do not have a mask, a mask will be given to you at the Main Entrance upon arrival. For doctor visits, patients may have 1 support person age 10 or older with them. For treatment visits, patients can not have anyone with them due to social distancing guidelines and our immunocompromised population.

## 2023-08-02 NOTE — Progress Notes (Signed)
 No injection needed today per Rebekah Pennington-PA-C. Patient's hemoglobin noted to be 11.

## 2023-08-27 LAB — LAB REPORT - SCANNED
A1c: 5.6
EGFR: 41

## 2023-09-13 ENCOUNTER — Inpatient Hospital Stay

## 2023-09-13 ENCOUNTER — Inpatient Hospital Stay: Attending: Hematology

## 2023-09-13 DIAGNOSIS — D631 Anemia in chronic kidney disease: Secondary | ICD-10-CM | POA: Insufficient documentation

## 2023-09-13 DIAGNOSIS — N1832 Chronic kidney disease, stage 3b: Secondary | ICD-10-CM | POA: Diagnosis present

## 2023-09-13 DIAGNOSIS — N1831 Chronic kidney disease, stage 3a: Secondary | ICD-10-CM

## 2023-09-13 DIAGNOSIS — D509 Iron deficiency anemia, unspecified: Secondary | ICD-10-CM | POA: Diagnosis not present

## 2023-09-13 LAB — CBC
HCT: 34.6 % — ABNORMAL LOW (ref 36.0–46.0)
Hemoglobin: 11 g/dL — ABNORMAL LOW (ref 12.0–15.0)
MCH: 28.5 pg (ref 26.0–34.0)
MCHC: 31.8 g/dL (ref 30.0–36.0)
MCV: 89.6 fL (ref 80.0–100.0)
Platelets: 207 10*3/uL (ref 150–400)
RBC: 3.86 MIL/uL — ABNORMAL LOW (ref 3.87–5.11)
RDW: 15.2 % (ref 11.5–15.5)
WBC: 6 10*3/uL (ref 4.0–10.5)
nRBC: 0 % (ref 0.0–0.2)

## 2023-09-13 NOTE — Progress Notes (Signed)
 No injection needed, patient's hemoglobin noted to be 11 today. Patient made aware and verbalized understanding.

## 2023-10-25 ENCOUNTER — Inpatient Hospital Stay

## 2023-10-25 ENCOUNTER — Inpatient Hospital Stay: Attending: Hematology

## 2023-10-25 DIAGNOSIS — N1832 Chronic kidney disease, stage 3b: Secondary | ICD-10-CM | POA: Diagnosis present

## 2023-10-25 DIAGNOSIS — D509 Iron deficiency anemia, unspecified: Secondary | ICD-10-CM | POA: Insufficient documentation

## 2023-10-25 DIAGNOSIS — D631 Anemia in chronic kidney disease: Secondary | ICD-10-CM | POA: Insufficient documentation

## 2023-10-25 LAB — CBC
HCT: 34.5 % — ABNORMAL LOW (ref 36.0–46.0)
Hemoglobin: 11.1 g/dL — ABNORMAL LOW (ref 12.0–15.0)
MCH: 28.7 pg (ref 26.0–34.0)
MCHC: 32.2 g/dL (ref 30.0–36.0)
MCV: 89.1 fL (ref 80.0–100.0)
Platelets: 173 10*3/uL (ref 150–400)
RBC: 3.87 MIL/uL (ref 3.87–5.11)
RDW: 15 % (ref 11.5–15.5)
WBC: 6.5 10*3/uL (ref 4.0–10.5)
nRBC: 0 % (ref 0.0–0.2)

## 2023-10-25 NOTE — Progress Notes (Signed)
 HGB 11.1. No Procrit given.

## 2023-12-06 ENCOUNTER — Inpatient Hospital Stay

## 2023-12-06 ENCOUNTER — Inpatient Hospital Stay: Attending: Hematology

## 2023-12-06 DIAGNOSIS — D631 Anemia in chronic kidney disease: Secondary | ICD-10-CM | POA: Diagnosis present

## 2023-12-06 DIAGNOSIS — D509 Iron deficiency anemia, unspecified: Secondary | ICD-10-CM | POA: Diagnosis present

## 2023-12-06 DIAGNOSIS — D696 Thrombocytopenia, unspecified: Secondary | ICD-10-CM

## 2023-12-06 DIAGNOSIS — N1831 Chronic kidney disease, stage 3a: Secondary | ICD-10-CM

## 2023-12-06 DIAGNOSIS — N1832 Chronic kidney disease, stage 3b: Secondary | ICD-10-CM | POA: Diagnosis present

## 2023-12-06 DIAGNOSIS — D649 Anemia, unspecified: Secondary | ICD-10-CM

## 2023-12-06 LAB — COMPREHENSIVE METABOLIC PANEL WITH GFR
ALT: 9 U/L (ref 0–44)
AST: 16 U/L (ref 15–41)
Albumin: 4.2 g/dL (ref 3.5–5.0)
Alkaline Phosphatase: 48 U/L (ref 38–126)
Anion gap: 9 (ref 5–15)
BUN: 20 mg/dL (ref 8–23)
CO2: 23 mmol/L (ref 22–32)
Calcium: 9.9 mg/dL (ref 8.9–10.3)
Chloride: 108 mmol/L (ref 98–111)
Creatinine, Ser: 1.51 mg/dL — ABNORMAL HIGH (ref 0.44–1.00)
GFR, Estimated: 36 mL/min — ABNORMAL LOW (ref >60)
Glucose, Bld: 102 mg/dL — ABNORMAL HIGH (ref 70–99)
Potassium: 4.3 mmol/L (ref 3.5–5.1)
Sodium: 140 mmol/L (ref 135–145)
Total Bilirubin: 1.1 mg/dL (ref 0.0–1.2)
Total Protein: 7.5 g/dL (ref 6.5–8.1)

## 2023-12-06 LAB — CBC WITH DIFFERENTIAL/PLATELET
Abs Immature Granulocytes: 0.02 K/uL (ref 0.00–0.07)
Basophils Absolute: 0 K/uL (ref 0.0–0.1)
Basophils Relative: 1 %
Eosinophils Absolute: 0.1 K/uL (ref 0.0–0.5)
Eosinophils Relative: 1 %
HCT: 33.6 % — ABNORMAL LOW (ref 36.0–46.0)
Hemoglobin: 11.2 g/dL — ABNORMAL LOW (ref 12.0–15.0)
Immature Granulocytes: 0 %
Lymphocytes Relative: 19 %
Lymphs Abs: 1.5 K/uL (ref 0.7–4.0)
MCH: 29.5 pg (ref 26.0–34.0)
MCHC: 33.3 g/dL (ref 30.0–36.0)
MCV: 88.4 fL (ref 80.0–100.0)
Monocytes Absolute: 0.4 K/uL (ref 0.1–1.0)
Monocytes Relative: 6 %
Neutro Abs: 5.7 K/uL (ref 1.7–7.7)
Neutrophils Relative %: 73 %
Platelets: 168 K/uL (ref 150–400)
RBC: 3.8 MIL/uL — ABNORMAL LOW (ref 3.87–5.11)
RDW: 14.7 % (ref 11.5–15.5)
WBC: 7.8 K/uL (ref 4.0–10.5)
nRBC: 0 % (ref 0.0–0.2)

## 2023-12-06 LAB — IRON AND TIBC
Iron: 24 ug/dL — ABNORMAL LOW (ref 28–170)
Saturation Ratios: 9 % — ABNORMAL LOW (ref 10.4–31.8)
TIBC: 283 ug/dL (ref 250–450)
UIBC: 259 ug/dL

## 2023-12-06 LAB — FERRITIN: Ferritin: 300 ng/mL (ref 11–307)

## 2023-12-06 NOTE — Progress Notes (Signed)
 HGB 11.2. No Retacrit  needed.  No complaints at this time. Alert and oriented x 3. F/U with [facility] as scheduled.

## 2023-12-07 ENCOUNTER — Telehealth: Payer: Self-pay | Admitting: *Deleted

## 2023-12-07 ENCOUNTER — Ambulatory Visit: Payer: Self-pay | Admitting: *Deleted

## 2023-12-07 NOTE — Telephone Encounter (Signed)
 Opened in error

## 2023-12-07 NOTE — Progress Notes (Signed)
 Discussed results with Delon Hope, NP.  Will proceed with Venofer  300 mg x 2 doses.  Patient aware and verbalized understanding.

## 2023-12-08 NOTE — Telephone Encounter (Signed)
 I called [facility] in [place] to have pts last results faxed - Should have results soon

## 2023-12-09 ENCOUNTER — Inpatient Hospital Stay

## 2023-12-09 VITALS — BP 124/75 | HR 66 | Temp 97.9°F | Resp 18

## 2023-12-09 DIAGNOSIS — N1831 Chronic kidney disease, stage 3a: Secondary | ICD-10-CM

## 2023-12-09 DIAGNOSIS — D509 Iron deficiency anemia, unspecified: Secondary | ICD-10-CM | POA: Diagnosis not present

## 2023-12-09 DIAGNOSIS — D649 Anemia, unspecified: Secondary | ICD-10-CM

## 2023-12-09 MED ORDER — ACETAMINOPHEN 325 MG PO TABS
650.0000 mg | ORAL_TABLET | Freq: Once | ORAL | Status: AC
  Administered 2023-12-09: 650 mg via ORAL
  Filled 2023-12-09: qty 2

## 2023-12-09 MED ORDER — CETIRIZINE HCL 10 MG PO TABS
10.0000 mg | ORAL_TABLET | Freq: Once | ORAL | Status: AC
  Administered 2023-12-09: 10 mg via ORAL
  Filled 2023-12-09: qty 1

## 2023-12-09 MED ORDER — FAMOTIDINE IN NACL 20-0.9 MG/50ML-% IV SOLN
20.0000 mg | Freq: Once | INTRAVENOUS | Status: AC
Start: 2023-12-09 — End: 2023-12-09
  Administered 2023-12-09: 20 mg via INTRAVENOUS
  Filled 2023-12-09: qty 50

## 2023-12-09 MED ORDER — SODIUM CHLORIDE 0.9 % IV SOLN
300.0000 mg | Freq: Once | INTRAVENOUS | Status: AC
  Administered 2023-12-09: 300 mg via INTRAVENOUS
  Filled 2023-12-09: qty 200

## 2023-12-09 MED ORDER — SODIUM CHLORIDE 0.9 % IV SOLN: INTRAVENOUS | Status: DC

## 2023-12-09 NOTE — Patient Instructions (Addendum)
 [facility] - A DEPT OF [facility]  Discharge Instructions: Thank you for choosing [facility] to provide your oncology and hematology care.  If you have a lab appointment with the Cancer Center - please note that after April 8th, 2024, all labs will be drawn in the cancer center.  You do not have to check in or register with the main entrance as you have in the past but will complete your check-in in the cancer center.  Wear comfortable clothing and clothing appropriate for easy access to any Portacath or PICC line.   We strive to give you quality time with your provider. You may need to reschedule your appointment if you arrive late (15 or more minutes).  Arriving late affects you and other patients whose appointments are after yours.  Also, if you miss three or more appointments without notifying the office, you may be dismissed from the clinic at the provider's discretion.      For prescription refill requests, have your pharmacy contact our office and allow 72 hours for refills to be completed.    Today you received the following iron  infusion iron  sucrose or Venofer     To help prevent nausea and vomiting after your treatment, we encourage you to take your nausea medication as directed.  BELOW ARE SYMPTOMS THAT SHOULD BE REPORTED IMMEDIATELY: *FEVER GREATER THAN 100.4 F (38 C) OR HIGHER *CHILLS OR SWEATING *NAUSEA AND VOMITING THAT IS NOT CONTROLLED WITH YOUR NAUSEA MEDICATION *UNUSUAL SHORTNESS OF BREATH *UNUSUAL BRUISING OR BLEEDING *URINARY PROBLEMS (pain or burning when urinating, or frequent urination) *BOWEL PROBLEMS (unusual diarrhea, constipation, pain near the anus) TENDERNESS IN MOUTH AND THROAT WITH OR WITHOUT PRESENCE OF ULCERS (sore throat, sores in mouth, or a toothache) UNUSUAL RASH, SWELLING OR PAIN  UNUSUAL VAGINAL DISCHARGE OR ITCHING   Items with * indicate a potential emergency and should be followed up as soon as  possible or go to the Emergency Department if any problems should occur.  Please show the CHEMOTHERAPY ALERT CARD or IMMUNOTHERAPY ALERT CARD at check-in to the Emergency Department and triage nurse.  Should you have questions after your visit or need to cancel or reschedule your appointment, please contact [facility] [place] - A DEPT OF [facility] [phone]  and follow the prompts.  Office hours are 8:00 a.m. to 4:30 p.m. Monday - Friday. Please note that voicemails left after 4:00 p.m. may not be returned until the following business day.  We are closed weekends and major holidays. You have access to a nurse at all times for urgent questions. Please call the main number to the clinic [phone] and follow the prompts.  For any non-urgent questions, you may also contact your provider using MyChart. We now offer e-Visits for anyone 34 and older to request care online for non-urgent symptoms. For details visit [email or web address].   Also download the MyChart app! Go to the app store, search MyChart, open the app, select [place], and log in with your MyChart username and password.

## 2023-12-09 NOTE — Progress Notes (Signed)
 Patient tolerated iron infusion with no complaints voiced.  Peripheral IV site clean and dry with good blood return noted before and after infusion.  Band aid applied. Pt observed for 30 minutes post iron infusion without any complications.  VSS with discharge and left in satisfactory condition with no s/s of distress noted. All follow ups as scheduled.   Walaa [employer]

## 2023-12-12 NOTE — Progress Notes (Unsigned)
 VIRTUAL VISIT via TELEPHONE NOTE [facility]   I connected with Jennifer Li  on 12/13/23 at  8:20 AM by telephone and verified that I am speaking with the correct person using two identifiers.  Location: Patient: Home Provider: [facility]   I discussed the limitations, risks, security and privacy concerns of performing an evaluation and management service by telephone and the availability of in person appointments. I also discussed with the patient that there may be a patient responsible charge related to this service. The patient expressed understanding and agreed to proceed.  REASON FOR VISIT:  Follow-up for iron  deficiency anemia and anemia of CKD   PRIOR THERAPY: None   CURRENT THERAPY: Intermittent IV iron  infusions, Retacrit  20,000 units every 6 weeks  INTERVAL HISTORY:   Jennifer Li 73 y.o. female returns for routine follow-up of her anemia of CKD.  She was last seen by Pleasant Barefoot PA-C on 08/02/2023.  At today's visit, she reports feeling overall well.   She denies any abnormal fatigue, but does report that she has been feeling more cold lately. Occasional scant rectal bleeding with bowel movements, but denies any major blood loss such as hematochezia, melena, hematemesis, or epistaxis. She denies any pica, restless legs, chest pain, dyspnea on exertion, lightheadedness, or syncope.    She does have occasional headaches. She is tolerating Retacrit  injections well and reports baseline blood pressure; no symptoms of DVT or PE. She has 75% energy and 100% appetite. She endorses that she is maintaining a stable weight.  ASSESSMENT & PLAN:  1.  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 Arlyss showed mild gastritis, diverticulosis, internal and external hemorrhoids, 1 polyp which was noncancerous. - Capsule endoscopy in September 2021 - Bone marrow biopsy on 08/07/2020, normocellular marrow with  trilineage hematopoiesis.  Mild polytypic plasmacytosis.  Chromosome analysis was normal.  SPEP was negative.  Free light chain ratio was normal with elevated kappa light chains consistent with CKD. - No prior history of blood transfusions.  No family history of sickle cell or thalassemia. - Has required intermittent IV Venofer . - She stopped iron  tablet due to lack of improvement. - She denies any bleeding per rectum or melena.     - She was started on Retacrit  in December 2022, current dose is 20,000 units Q6 weeks, last given on 06/21/2023 - Most recent labs (12/06/2023): Hgb 11.2/MCV 88.4, ferritin 300, iron  saturation 9%.  Baseline CKD (creatinine 1.51/GFR 36) - PLAN: Continue with Venofer  300 mg x 2 (given on 12/09/2023).  Second dose scheduled to be given on 12/16/2023. - We will HOLD Retacrit  for the time being - has not needed since January 2025  - Labs and RTC in 3 months  2.  Mild thrombocytopenia and leukopenia - Patient has had intermittent mild thrombocytopenia since March 2022.  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 - Bone marrow biopsy (08/07/2020): Normocellular marrow with trilineage hematopoiesis, as described above. - Lowest platelets 105 on 09/16/2021 - Nutritional panel (09/30/2021): Normal B12, folate, copper  - She occasionally takes Red Clover and drinks dandelion tea.  She denies any quinine containing supplements or tonic water.   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  We will continue to monitor with CBCs as above.   If significant  worsening or persistence of thrombocytopenia, would consider bone marrow biopsy.      3.  Stage IIIa/b CKD: - She follows with Dr. Kimberlee in [place], [place]  PLAN SUMMARY:  >> Continue with Venofer  as scheduled (12/16/2023) >> HOLD Retacrit  protocol for now >> Same-day labs (CBC/D, CMP, ferritin, iron /TIBC) + OFFICE visit in 3 months  **Office note to be faxed to Dr. Kimberlee [facility]) at [phone]     REVIEW OF SYSTEMS:   Review of Systems  Constitutional:  Negative for chills, diaphoresis, fever, malaise/fatigue and weight loss.  Respiratory:  Negative for cough and shortness of breath.   Cardiovascular:  Positive for palpitations. Negative for chest pain.  Gastrointestinal:  Negative for abdominal pain, blood in stool, melena, nausea and vomiting.  Neurological:  Negative for dizziness and headaches.     PHYSICAL EXAM: (per limitations of virtual telephone visit)  The patient is alert and oriented x 3, exhibiting adequate mentation, good mood, and ability to speak in full sentences and execute sound judgement.  WRAP UP:   I discussed the assessment and treatment plan with the patient. The patient was provided an opportunity to ask questions and all were answered. The patient agreed with the plan and demonstrated an understanding of the instructions.   The patient was advised to call back or seek an in-person evaluation if the symptoms worsen or if the condition fails to improve as anticipated.  I provided 34 minutes of non-face-to-face time during this encounter, including > 10 minutes of medical discussion.  Pleasant CHRISTELLA Barefoot, PA-C 12/13/23 8:56 AM

## 2023-12-13 ENCOUNTER — Inpatient Hospital Stay: Admitting: Physician Assistant

## 2023-12-13 DIAGNOSIS — D509 Iron deficiency anemia, unspecified: Secondary | ICD-10-CM

## 2023-12-13 DIAGNOSIS — D72819 Decreased white blood cell count, unspecified: Secondary | ICD-10-CM

## 2023-12-13 DIAGNOSIS — D649 Anemia, unspecified: Secondary | ICD-10-CM

## 2023-12-13 DIAGNOSIS — N1831 Chronic kidney disease, stage 3a: Secondary | ICD-10-CM | POA: Diagnosis not present

## 2023-12-13 DIAGNOSIS — D631 Anemia in chronic kidney disease: Secondary | ICD-10-CM

## 2023-12-13 DIAGNOSIS — D696 Thrombocytopenia, unspecified: Secondary | ICD-10-CM | POA: Diagnosis not present

## 2023-12-16 ENCOUNTER — Inpatient Hospital Stay

## 2023-12-16 VITALS — BP 140/75 | HR 65 | Temp 97.4°F | Resp 18

## 2023-12-16 DIAGNOSIS — D509 Iron deficiency anemia, unspecified: Secondary | ICD-10-CM | POA: Diagnosis not present

## 2023-12-16 DIAGNOSIS — N1831 Chronic kidney disease, stage 3a: Secondary | ICD-10-CM

## 2023-12-16 DIAGNOSIS — D649 Anemia, unspecified: Secondary | ICD-10-CM

## 2023-12-16 MED ORDER — SODIUM CHLORIDE 0.9 % IV SOLN
300.0000 mg | Freq: Once | INTRAVENOUS | Status: AC
  Administered 2023-12-16: 300 mg via INTRAVENOUS
  Filled 2023-12-16: qty 300

## 2023-12-16 MED ORDER — SODIUM CHLORIDE 0.9 % IV SOLN
INTRAVENOUS | Status: DC
Start: 2023-12-16 — End: 2023-12-16

## 2023-12-16 MED ORDER — FAMOTIDINE IN NACL 20-0.9 MG/50ML-% IV SOLN
20.0000 mg | Freq: Once | INTRAVENOUS | Status: AC
  Administered 2023-12-16: 20 mg via INTRAVENOUS
  Filled 2023-12-16: qty 50

## 2023-12-16 MED ORDER — CETIRIZINE HCL 10 MG PO TABS
10.0000 mg | ORAL_TABLET | Freq: Once | ORAL | Status: AC
  Administered 2023-12-16: 10 mg via ORAL
  Filled 2023-12-16: qty 1

## 2023-12-16 MED ORDER — ACETAMINOPHEN 325 MG PO TABS
650.0000 mg | ORAL_TABLET | Freq: Once | ORAL | Status: AC
  Administered 2023-12-16: 650 mg via ORAL
  Filled 2023-12-16: qty 2

## 2023-12-16 NOTE — Progress Notes (Signed)
Patient presents today for iron infusion.  Patient is in satisfactory condition with no new complaints voiced.  Vital signs are stable.  IV placed in L arm.  IV flushed well with good blood return noted.   We will proceed with infusion per provider orders.    Patient tolerated infusion well with no complaints voiced.  Patient left ambulatory in stable condition.  Vital signs stable at discharge.  Follow up as scheduled.    

## 2023-12-16 NOTE — Patient Instructions (Signed)
 [facility] [place] - A DEPT OF [facility]  Discharge Instructions: Thank you for choosing [facility] to provide your oncology and hematology care.  If you have a lab appointment with the Cancer Center - please note that after April 8th, 2024, all labs will be drawn in the cancer center.  You do not have to check in or register with the main entrance as you have in the past but will complete your check-in in the cancer center.  Wear comfortable clothing and clothing appropriate for easy access to any Portacath or PICC line.   We strive to give you quality time with your provider. You may need to reschedule your appointment if you arrive late (15 or more minutes).  Arriving late affects you and other patients whose appointments are after yours.  Also, if you miss three or more appointments without notifying the office, you may be dismissed from the clinic at the provider's discretion.      For prescription refill requests, have your pharmacy contact our office and allow 72 hours for refills to be completed.    Today you received the following:  Venofer.    Iron Sucrose Injection What is this medication? IRON SUCROSE (EYE ern SOO krose) treats low levels of iron (iron deficiency anemia) in people with kidney disease. Iron is a mineral that plays an important role in making red blood cells, which carry oxygen from your lungs to the rest of your body. This medicine may be used for other purposes; ask your health care provider or pharmacist if you have questions. COMMON BRAND NAME(S): Venofer What should I tell my care team before I take this medication? They need to know if you have any of these conditions: Anemia not caused by low iron levels Heart disease High levels of iron in the blood Kidney disease Liver disease An unusual or allergic reaction to iron, other medications, foods, dyes, or preservatives Pregnant or trying to get  pregnant Breastfeeding How should I use this medication? This medication is infused into a vein. It is given by your care team in a hospital or clinic setting. Talk to your care team about the use of this medication in children. While it may be prescribed for children as young as 2 years for selected conditions, precautions do apply. Overdosage: If you think you have taken too much of this medicine contact a poison control center or emergency room at once. NOTE: This medicine is only for you. Do not share this medicine with others. What if I miss a dose? Keep appointments for follow-up doses. It is important not to miss your dose. Call your care team if you are unable to keep an appointment. What may interact with this medication? Do not take this medication with any of the following: Deferoxamine Dimercaprol Other iron products This medication may also interact with the following: Chloramphenicol Deferasirox This list may not describe all possible interactions. Give your health care provider a list of all the medicines, herbs, non-prescription drugs, or dietary supplements you use. Also tell them if you smoke, drink alcohol, or use illegal drugs. Some items may interact with your medicine. What should I watch for while using this medication? Visit your care team for regular checks on your progress. Tell your care team if your symptoms do not start to get better or if they get worse. You may need blood work done while you are taking this medication. You may need to eat  more foods that contain iron. Talk to your care team. Foods that contain iron include whole grains or cereals, dried fruits, beans, peas, leafy green vegetables, and organ meats (liver, kidney). What side effects may I notice from receiving this medication? Side effects that you should report to your care team as soon as possible: Allergic reactions--skin rash, itching, hives, swelling of the face, lips, tongue, or throat Low  blood pressure--dizziness, feeling faint or lightheaded, blurry vision Shortness of breath Side effects that usually do not require medical attention (report to your care team if they continue or are bothersome): Flushing Headache Joint pain Muscle pain Nausea Pain, redness, or irritation at injection site This list may not describe all possible side effects. Call your doctor for medical advice about side effects. You may report side effects to FDA at 1-800-FDA-1088. Where should I keep my medication? This medication is given in a hospital or clinic. It will not be stored at home. NOTE: This sheet is a summary. It may not cover all possible information. If you have questions about this medicine, talk to your doctor, pharmacist, or health care provider.  2024 Elsevier/Gold Standard (2023-01-05 00:00:00)   To help prevent nausea and vomiting after your treatment, we encourage you to take your nausea medication as directed.  BELOW ARE SYMPTOMS THAT SHOULD BE REPORTED IMMEDIATELY: *FEVER GREATER THAN 100.4 F (38 C) OR HIGHER *CHILLS OR SWEATING *NAUSEA AND VOMITING THAT IS NOT CONTROLLED WITH YOUR NAUSEA MEDICATION *UNUSUAL SHORTNESS OF BREATH *UNUSUAL BRUISING OR BLEEDING *URINARY PROBLEMS (pain or burning when urinating, or frequent urination) *BOWEL PROBLEMS (unusual diarrhea, constipation, pain near the anus) TENDERNESS IN MOUTH AND THROAT WITH OR WITHOUT PRESENCE OF ULCERS (sore throat, sores in mouth, or a toothache) UNUSUAL RASH, SWELLING OR PAIN  UNUSUAL VAGINAL DISCHARGE OR ITCHING   Items with * indicate a potential emergency and should be followed up as soon as possible or go to the Emergency Department if any problems should occur.  Please show the CHEMOTHERAPY ALERT CARD or IMMUNOTHERAPY ALERT CARD at check-in to the Emergency Department and triage nurse.  Should you have questions after your visit or need to cancel or reschedule your appointment, please contact [facility] CANCER  CTR [place] - A DEPT OF [facility]igh[facility] [phone]  and follow the prompts.  Office hours are 8:00 a.m. to 4:30 p.m. Monday - Friday. Please note that voicemails left after 4:00 p.m. may not be returned until the following business day.  We are closed weekends and major holidays. You have access to a nurse at all times for urgent questions. Please call the main number to the clinic [phone] and follow the prompts.  For any non-urgent questions, you may also contact your provider using MyChart. We now offer e-Visits for anyone 24 and older to request care online for non-urgent symptoms. For details visit [email or web address].   Also download the MyChart app! Go to the app store, search "MyChart", open the app, select [place], and log in with your MyChart username and password.

## 2024-03-12 NOTE — Progress Notes (Unsigned)
 [facility] [address], [place] [address]   CLINIC:  Medical Oncology/Hematology  PCP:  Anitra Delon ORN, NP [address] [place] [address] [phone]   REASON FOR VISIT:  Follow-up for iron  deficiency anemia and anemia of CKD   PRIOR THERAPY: Retacrit  (currently on HOLD)   CURRENT THERAPY: Intermittent IV iron  infusions  INTERVAL HISTORY:   [name] 73 y.o. female returns for routine follow-up of her anemia of CKD.   She was last evaluated via telemedicine visit by Pleasant Barefoot PA-C on 12/13/2023.  At today's visit, she reports feeling overall well.  *** She denies any abnormal fatigue.*** Occasional scant rectal bleeding with bowel movements, but denies any major blood loss such as hematochezia, melena, hematemesis, or epistaxis.*** She denies any pica, restless legs, chest pain, dyspnea on exertion, lightheadedness, or syncope.   *** She does have occasional headaches.*** She has 75***% energy and 100***% appetite.  ***She endorses that she is maintaining a stable weight.  ASSESSMENT & PLAN:  1.  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 Arlyss showed mild gastritis, diverticulosis, internal and external hemorrhoids, 1 polyp which was noncancerous. - Capsule endoscopy in September 2021 - Bone marrow biopsy on 08/07/2020, normocellular marrow with trilineage hematopoiesis.  Mild polytypic plasmacytosis.  Chromosome analysis was normal.  SPEP was negative.  Free light chain ratio was normal with elevated kappa light chains consistent with CKD. - No prior history of blood transfusions.  No family history of sickle cell or thalassemia. - Has required intermittent IV Venofer , most recently with Venofer  300 mg x 2 in July 2025 - She stopped iron  tablet due to lack of improvement. - She denies any bleeding per rectum or melena.     - She was started on Retacrit  in December 2022, but since she  had not required injections since 06/21/2023, Retacrit  was placed ON HOLD in July 2025 - Most recent labs (***): Hgb ***/MCV ***, ferritin ***, iron  saturation ***%.  Baseline CKD (creatinine ***/GFR ***) - PLAN: *** TBD iron  - *** TBD Retacrit  - Labs and RTC in 3 months  2.  Mild thrombocytopenia and leukopenia - Patient has had intermittent mild thrombocytopenia since March 2022.  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 - Bone marrow biopsy (08/07/2020): Normocellular marrow with trilineage hematopoiesis, as described above. - Lowest platelets 105 on 09/16/2021 - Nutritional panel (09/30/2021): Normal B12, folate, copper  - She occasionally takes Red Clover and drinks dandelion tea.  She denies any quinine containing supplements or tonic water.   - No known history of liver disease, autoimmune disorder, or connective tissue disorder  - She denies any bleeding, bruising, petechial rash   *** - Most recent CBC (***) shows normal platelets (***), normal WBC/differential*** - Differential diagnosis includes possible mild chronic ITP versus thrombocytopenic effect of herbal medication - PLAN: No indication for treatment at this time.  We will continue to monitor with CBCs as above.   If significant worsening or persistence of thrombocytopenia, would consider bone marrow biopsy.      3.  Stage IIIa/b CKD: - She follows with Dr. Kimberlee in [place], [place]  PLAN SUMMARY: *** TBD *** >> Continue with Venofer  as scheduled (12/16/2023) >> HOLD Retacrit  protocol for now >> Same-day labs (CBC/D, CMP, ferritin, iron /TIBC) + OFFICE visit in 3 months  **Office note to be faxed to Dr.  Kimberlee [facility]) at [phone]    [facility] **VISIT SUMMARY & IMPORTANT INSTRUCTIONS **   You were seen today by Pleasant Barefoot PA-C for your ***.     *** ***  *** ***  LABS: Return in ***   OTHER TESTS: ***  MEDICATIONS: ***  FOLLOW-UP APPOINTMENT: ***     REVIEW OF SYSTEMS: ***  Review of Systems - Oncology   PHYSICAL EXAM:  ECOG PERFORMANCE STATUS: {CHL ONC ECOG ED:[id number]} *** There were no vitals filed for this visit. There were no vitals filed for this visit. Physical Exam  PAST MEDICAL/SURGICAL HISTORY:  Past Medical History:  Diagnosis Date   Arthritis    Cataract    Chronic kidney disease    stage 3   Heart murmur    Past Surgical History:  Procedure Laterality Date   ABDOMINAL HYSTERECTOMY     CATARACT EXTRACTION, BILATERAL     gaglion cyst removal     MYOMECTOMY      SOCIAL HISTORY:  Social History   Socioeconomic History   Marital status: Widowed    Spouse name: Not on file   Number of children: Not on file   Years of education: Not on file   Highest education level: Not on file  Occupational History   Not on file  Tobacco Use   Smoking status: Former   Smokeless tobacco: Never  Substance and Sexual Activity   Alcohol use: Never   Drug use: Never   Sexual activity: Not Currently  Other Topics Concern   Not on file  Social History Narrative   Not on file   Social Drivers of Health   Financial Resource Strain: Low Risk  (06/20/2020)   Overall Financial Resource Strain (CARDIA)    Difficulty of Paying Living Expenses: Not hard at all  Food Insecurity: Food Insecurity Present (06/20/2020)   Hunger Vital Sign    Worried About Running Out of Food in the Last Year: Sometimes true    Ran Out of Food in the Last Year: Sometimes true  Transportation Needs: Unmet Transportation Needs (06/20/2020)   PRAPARE - Administrator, Civil Service (Medical): Yes    Lack of Transportation (Non-Medical): No  Physical Activity: Not on file  Stress: No Stress Concern Present (06/20/2020)   [employer] of Occupational Health - Occupational Stress Questionnaire    Feeling of  Stress : Not at all  Social Connections: Moderately Integrated (06/20/2020)   Social Connection and Isolation Panel    Frequency of Communication with Friends and Family: More than three times a week    Frequency of Social Gatherings with Friends and Family: Once a week    Attends Religious Services: More than 4 times per year    Active Member of [employer] or Organizations: Yes    Attends Banker Meetings: Never    Marital Status: Widowed  Intimate Partner Violence: Not At Risk (06/20/2020)   Humiliation, Afraid, Rape, and Kick questionnaire    Fear of Current or Ex-Partner: No    Emotionally Abused: No    Physically Abused: No    Sexually Abused: No    FAMILY HISTORY:  Family History  Problem Relation Age of Onset   Diabetes Sister    Diabetes Son     CURRENT MEDICATIONS:  Outpatient Encounter Medications as of 03/13/2024  Medication Sig   amLODipine (NORVASC) 10 MG tablet Take 10 mg by mouth daily.   ascorbic  acid (VITAMIN C) 1000 MG tablet Vitamin C 1,000 mg tablet  Take 1 tablet every day by oral route.   B Complex-C (B-COMPLEX WITH VITAMIN C) tablet Take by mouth.   cholecalciferol (VITAMIN D3) 25 MCG (1000 UNIT) tablet Take 1,000 Units by mouth daily.   cyanocobalamin (VITAMIN B12) 1000 MCG tablet Take 1,000 mcg by mouth daily.   ferrous gluconate (FERATE) 240 (27 FE) MG tablet 1 tab(s) orally once a day   Magnesium 250 MG TABS Take by mouth.   Metoprolol Tartrate 37.5 MG TABS Take 37.5 mg by mouth 2 (two) times daily.   metroNIDAZOLE (FLAGYL) 500 MG tablet 1 tab(s) orally twice a day for 7 days   Misc Natural Products (WHITE [place]OW BARK PO) Take by mouth.   niacin (VITAMIN B3) 500 MG ER tablet TAKE 1 TABLET BY MOUTH EVERY DAY AT BEDTIME FOR 90 DAYS   nitroGLYCERIN (NITROSTAT) 0.4 MG SL tablet    RESTASIS 0.05 % ophthalmic emulsion SMARTSIG:1 Drop(s) In Eye(s) Every 12 Hours   spironolactone (ALDACTONE) 25 MG tablet spironolactone 25 mg tablet   tamsulosin  (FLOMAX) 0.4 MG CAPS capsule 1 cap(s) orally once a day for 10 days   triamcinolone ointment (KENALOG) 0.1 % Apply topically 2 (two) times daily.   No facility-administered encounter medications on file as of 03/13/2024.    ALLERGIES:  Allergies  Allergen Reactions   Escitalopram    Gemfibrozil Hives and Other (See Comments)   Sulfa Antibiotics Hives   Sulfur    Trazodone    Valsartan Other (See Comments)    Patient says that she does not have allergy to this    LABORATORY DATA:  I have reviewed the labs as listed.  CBC    Component Value Date/Time   WBC 7.8 12/06/2023 1032   RBC 3.80 (L) 12/06/2023 1032   HGB 11.2 (L) 12/06/2023 1032   HCT 33.6 (L) 12/06/2023 1032   HCT 29.8 (L) 06/20/2020 1315   PLT 168 12/06/2023 1032   MCV 88.4 12/06/2023 1032   MCH 29.5 12/06/2023 1032   MCHC 33.3 12/06/2023 1032   RDW 14.7 12/06/2023 1032   LYMPHSABS 1.5 12/06/2023 1032   MONOABS 0.4 12/06/2023 1032   EOSABS 0.1 12/06/2023 1032   BASOSABS 0.0 12/06/2023 1032      Latest Ref Rng & Units 12/06/2023   10:32 AM 08/02/2023    8:20 AM 12/30/2022   10:29 AM  CMP  Glucose 70 - 99 mg/dL 897  93  886   BUN 8 - 23 mg/dL 20  20  24    Creatinine 0.44 - 1.00 mg/dL 8.48  8.68  8.83   Sodium 135 - 145 mmol/L 140  140  140   Potassium 3.5 - 5.1 mmol/L 4.3  4.5  4.0   Chloride 98 - 111 mmol/L 108  107  108   CO2 22 - 32 mmol/L 23  23  26    Calcium 8.9 - 10.3 mg/dL 9.9  9.9  9.4   Total Protein 6.5 - 8.1 g/dL 7.5  7.3  7.1   Total Bilirubin 0.0 - 1.2 mg/dL 1.1  0.8  1.0   Alkaline Phos 38 - 126 U/L 48  40  36   AST 15 - 41 U/L 16  23  21    ALT 0 - 44 U/L 9  15  16      DIAGNOSTIC IMAGING:  I have independently reviewed the relevant imaging and discussed with the patient.   WRAP UP:  All questions were answered. The patient knows to call the clinic with any problems, questions or concerns.  Medical decision making: ***  Time spent on visit: I spent *** minutes counseling the patient  face to face. The total time spent in the appointment was *** minutes and more than 50% was on counseling.  Pleasant CHRISTELLA Barefoot, PA-C  ***

## 2024-03-13 ENCOUNTER — Inpatient Hospital Stay: Attending: Hematology

## 2024-03-13 ENCOUNTER — Inpatient Hospital Stay: Admitting: Physician Assistant

## 2024-03-13 DIAGNOSIS — D509 Iron deficiency anemia, unspecified: Secondary | ICD-10-CM | POA: Diagnosis present

## 2024-03-13 DIAGNOSIS — D649 Anemia, unspecified: Secondary | ICD-10-CM

## 2024-03-13 DIAGNOSIS — D72819 Decreased white blood cell count, unspecified: Secondary | ICD-10-CM | POA: Insufficient documentation

## 2024-03-13 DIAGNOSIS — Z79899 Other long term (current) drug therapy: Secondary | ICD-10-CM | POA: Diagnosis not present

## 2024-03-13 DIAGNOSIS — N1832 Chronic kidney disease, stage 3b: Secondary | ICD-10-CM | POA: Insufficient documentation

## 2024-03-13 DIAGNOSIS — Z87891 Personal history of nicotine dependence: Secondary | ICD-10-CM | POA: Insufficient documentation

## 2024-03-13 DIAGNOSIS — N1831 Chronic kidney disease, stage 3a: Secondary | ICD-10-CM

## 2024-03-13 DIAGNOSIS — D696 Thrombocytopenia, unspecified: Secondary | ICD-10-CM | POA: Insufficient documentation

## 2024-03-13 DIAGNOSIS — D631 Anemia in chronic kidney disease: Secondary | ICD-10-CM

## 2024-03-13 LAB — COMPREHENSIVE METABOLIC PANEL WITH GFR
ALT: 9 U/L (ref 0–44)
AST: 19 U/L (ref 15–41)
Albumin: 4.3 g/dL (ref 3.5–5.0)
Alkaline Phosphatase: 50 U/L (ref 38–126)
Anion gap: 8 (ref 5–15)
BUN: 17 mg/dL (ref 8–23)
CO2: 27 mmol/L (ref 22–32)
Calcium: 9.9 mg/dL (ref 8.9–10.3)
Chloride: 104 mmol/L (ref 98–111)
Creatinine, Ser: 1.2 mg/dL — ABNORMAL HIGH (ref 0.44–1.00)
GFR, Estimated: 47 mL/min — ABNORMAL LOW (ref >60)
Glucose, Bld: 95 mg/dL (ref 70–99)
Potassium: 4.1 mmol/L (ref 3.5–5.1)
Sodium: 139 mmol/L (ref 135–145)
Total Bilirubin: 0.6 mg/dL (ref 0.0–1.2)
Total Protein: 7.2 g/dL (ref 6.5–8.1)

## 2024-03-13 LAB — CBC WITH DIFFERENTIAL/PLATELET
Abs Immature Granulocytes: 0.01 K/uL (ref 0.00–0.07)
Basophils Absolute: 0 K/uL (ref 0.0–0.1)
Basophils Relative: 1 %
Eosinophils Absolute: 0.1 K/uL (ref 0.0–0.5)
Eosinophils Relative: 3 %
HCT: 33.9 % — ABNORMAL LOW (ref 36.0–46.0)
Hemoglobin: 11 g/dL — ABNORMAL LOW (ref 12.0–15.0)
Immature Granulocytes: 0 %
Lymphocytes Relative: 31 %
Lymphs Abs: 1.3 K/uL (ref 0.7–4.0)
MCH: 29.1 pg (ref 26.0–34.0)
MCHC: 32.4 g/dL (ref 30.0–36.0)
MCV: 89.7 fL (ref 80.0–100.0)
Monocytes Absolute: 0.4 K/uL (ref 0.1–1.0)
Monocytes Relative: 9 %
Neutro Abs: 2.5 K/uL (ref 1.7–7.7)
Neutrophils Relative %: 56 %
Platelets: 162 K/uL (ref 150–400)
RBC: 3.78 MIL/uL — ABNORMAL LOW (ref 3.87–5.11)
RDW: 15.4 % (ref 11.5–15.5)
WBC: 4.3 K/uL (ref 4.0–10.5)
nRBC: 0 % (ref 0.0–0.2)

## 2024-03-13 LAB — IRON AND TIBC
Iron: 57 ug/dL (ref 28–170)
Saturation Ratios: 20 % (ref 10.4–31.8)
TIBC: 281 ug/dL (ref 250–450)
UIBC: 225 ug/dL

## 2024-03-13 LAB — FERRITIN: Ferritin: 759 ng/mL — ABNORMAL HIGH (ref 11–307)

## 2024-03-13 NOTE — Patient Instructions (Signed)
 [facility] at [facility] Discharge Instructions  You were seen today by Pleasant Barefoot PA-C for your anemia.  Your blood counts have improved.   You do NOT need any iron  right now.  (Your iron  / ferritin levels are slightly elevated.) You do not need to restart Retacrit  shots - your blood is at goal (Hgb > 10.0)  FOLLOW-UP APPOINTMENT: Follow-up visit in 4 months  ** Thank you for trusting me with your healthcare!  I strive to provide all of my patients with quality care at each visit.  If you receive a survey for this visit, I would be so grateful to you for taking the time to provide feedback.  Thank you in advance!  ~ Bria Sparr                                        Dr. Mickiel Davonna Pleasant Barefoot, PA-C      Delon Hope, NP   - - - - - - - - - - - - - - - - - -   The website for  [facility] About Herbs is an excellent resource on various herbal medications.  Although it does not include every herbal supplement available, it does include 250+ common supplements.  [email or web address]://[email or web address]-[email or web address]   - - - - - - - - - - - - - - - - - -   Thank you for choosing [facility] at [facility] to provide your oncology and hematology care.  To afford each patient quality time with our provider, please arrive at least 15 minutes before your scheduled appointment time.   If you have a lab appointment with the Cancer Center please come in thru the Main Entrance and check in at the main information desk.  You need to re-schedule your appointment should you arrive 10 or more minutes late.  We strive to give you quality time with our providers, and arriving late affects you and other patients whose appointments are after yours.  Also, if you no show three or more times for appointments you may be dismissed from the clinic at the providers  discretion.     Again, thank you for choosing [facility].  Our hope is that these requests will decrease the amount of time that you wait before being seen by our physicians.       _____________________________________________________________  Should you have questions after your visit to [facility], please contact our office at [phone] and follow the prompts.  Our office hours are 8:00 a.m. and 4:30 p.m. Monday - Friday.  Please note that voicemails left after 4:00 p.m. may not be returned until the following business day.  We are closed weekends and major holidays.  You do have access to a nurse 24-7, just call the main number to the clinic [phone] and do not press any options, hold on the line and a nurse will answer the phone.    For prescription refill requests, have your pharmacy contact our office and allow 72 hours.    Due to Covid, you will need to wear a mask upon entering the hospital. If you do not have a mask, a mask will be given to you at the Main Entrance upon arrival. For doctor visits, patients may have 1 support person age  18 or older with them. For treatment visits, patients can not have anyone with them due to social distancing guidelines and our immunocompromised population.

## 2024-03-19 ENCOUNTER — Encounter: Payer: Self-pay | Admitting: *Deleted

## 2024-03-19 NOTE — Progress Notes (Addendum)
 She called and wanted to let Jennifer Barefoot, PA know that she misspoke during her last visit. She is taking an iron  supplement. She is taking fermented iron . She takes grape juice, tarted cherry and something else. She felt like the provider needed to know in case that helps drive treatment options for her. She also states that she does this when she gets cold. Primary provider aware. She suggests that patient needs to stop the fermented iron  supplements because her iron  levels are too high right now.  Provider felt patient might need to see PCP for endocrine workup incase that is the reason for cold feeling.  I have called patient back and advised on the above. She is appreciative, understanding and verbalizes that she will stop iron  and will call her PCP about the coldness.

## 2024-07-17 ENCOUNTER — Inpatient Hospital Stay: Admitting: Physician Assistant

## 2024-07-17 ENCOUNTER — Inpatient Hospital Stay
